# Patient Record
Sex: Female | Born: 1960
Health system: Southern US, Community
[De-identification: ages and names within clinical notes are randomized; demographics above are authoritative.]

## PROBLEM LIST (undated history)

## (undated) DIAGNOSIS — Z8711 Personal history of peptic ulcer disease: Secondary | ICD-10-CM

## (undated) DIAGNOSIS — F419 Anxiety disorder, unspecified: Secondary | ICD-10-CM

## (undated) DIAGNOSIS — K219 Gastro-esophageal reflux disease without esophagitis: Secondary | ICD-10-CM

## (undated) DIAGNOSIS — H8110 Benign paroxysmal vertigo, unspecified ear: Secondary | ICD-10-CM

## (undated) DIAGNOSIS — E7800 Pure hypercholesterolemia, unspecified: Secondary | ICD-10-CM

## (undated) DIAGNOSIS — Z8719 Personal history of other diseases of the digestive system: Secondary | ICD-10-CM

## (undated) DIAGNOSIS — R718 Other abnormality of red blood cells: Secondary | ICD-10-CM

## (undated) DIAGNOSIS — T7840XA Allergy, unspecified, initial encounter: Secondary | ICD-10-CM

## (undated) DIAGNOSIS — Z9889 Other specified postprocedural states: Secondary | ICD-10-CM

## (undated) DIAGNOSIS — M1990 Unspecified osteoarthritis, unspecified site: Secondary | ICD-10-CM

## (undated) DIAGNOSIS — R112 Nausea with vomiting, unspecified: Secondary | ICD-10-CM

## (undated) DIAGNOSIS — I10 Essential (primary) hypertension: Secondary | ICD-10-CM

## (undated) DIAGNOSIS — G43909 Migraine, unspecified, not intractable, without status migrainosus: Secondary | ICD-10-CM

## (undated) DIAGNOSIS — D649 Anemia, unspecified: Secondary | ICD-10-CM

## (undated) HISTORY — DX: Nausea with vomiting, unspecified: R11.2

## (undated) HISTORY — DX: Other specified postprocedural states: Z98.890

## (undated) HISTORY — DX: Personal history of other diseases of the digestive system: Z87.19

## (undated) HISTORY — DX: Migraine, unspecified, not intractable, without status migrainosus: G43.909

## (undated) HISTORY — DX: Benign paroxysmal vertigo, unspecified ear: H81.10

## (undated) HISTORY — DX: Gastro-esophageal reflux disease without esophagitis: K21.9

## (undated) HISTORY — DX: Other abnormality of red blood cells: R71.8

## (undated) HISTORY — DX: Anxiety disorder, unspecified: F41.9

## (undated) HISTORY — DX: Anemia, unspecified: D64.9

## (undated) HISTORY — DX: Allergy, unspecified, initial encounter: T78.40XA

## (undated) HISTORY — DX: Personal history of peptic ulcer disease: Z87.11

## (undated) HISTORY — DX: Unspecified osteoarthritis, unspecified site: M19.90

---

## 1984-03-07 HISTORY — PX: TUBAL LIGATION: SHX77

## 1997-06-05 ENCOUNTER — Encounter: Admission: RE | Admit: 1997-06-05 | Discharge: 1997-06-05 | Payer: Self-pay | Admitting: Family Medicine

## 1997-06-09 ENCOUNTER — Encounter: Admission: RE | Admit: 1997-06-09 | Discharge: 1997-06-09 | Payer: Self-pay | Admitting: Family Medicine

## 1997-06-24 ENCOUNTER — Encounter: Admission: RE | Admit: 1997-06-24 | Discharge: 1997-06-24 | Payer: Self-pay | Admitting: Family Medicine

## 1998-01-20 ENCOUNTER — Emergency Department (HOSPITAL_COMMUNITY): Admission: EM | Admit: 1998-01-20 | Discharge: 1998-01-20 | Payer: Self-pay | Admitting: Emergency Medicine

## 1998-02-13 ENCOUNTER — Encounter: Admission: RE | Admit: 1998-02-13 | Discharge: 1998-02-13 | Payer: Self-pay | Admitting: Family Medicine

## 1999-08-22 ENCOUNTER — Emergency Department (HOSPITAL_COMMUNITY): Admission: EM | Admit: 1999-08-22 | Discharge: 1999-08-22 | Payer: Self-pay | Admitting: *Deleted

## 2000-03-07 HISTORY — PX: ABDOMINAL HYSTERECTOMY: SHX81

## 2000-05-10 ENCOUNTER — Encounter: Admission: RE | Admit: 2000-05-10 | Discharge: 2000-05-10 | Payer: Self-pay | Admitting: *Deleted

## 2000-05-10 ENCOUNTER — Encounter: Payer: Self-pay | Admitting: *Deleted

## 2000-06-15 ENCOUNTER — Encounter (INDEPENDENT_AMBULATORY_CARE_PROVIDER_SITE_OTHER): Payer: Self-pay

## 2000-06-15 ENCOUNTER — Inpatient Hospital Stay (HOSPITAL_COMMUNITY): Admission: RE | Admit: 2000-06-15 | Discharge: 2000-06-17 | Payer: Self-pay | Admitting: *Deleted

## 2001-04-26 ENCOUNTER — Encounter: Payer: Self-pay | Admitting: Emergency Medicine

## 2001-04-26 ENCOUNTER — Emergency Department (HOSPITAL_COMMUNITY): Admission: EM | Admit: 2001-04-26 | Discharge: 2001-04-26 | Payer: Self-pay | Admitting: Emergency Medicine

## 2001-05-08 ENCOUNTER — Encounter: Payer: Self-pay | Admitting: *Deleted

## 2001-05-08 ENCOUNTER — Ambulatory Visit (HOSPITAL_COMMUNITY): Admission: RE | Admit: 2001-05-08 | Discharge: 2001-05-08 | Payer: Self-pay | Admitting: *Deleted

## 2001-06-06 ENCOUNTER — Encounter: Payer: Self-pay | Admitting: *Deleted

## 2001-06-06 ENCOUNTER — Ambulatory Visit (HOSPITAL_COMMUNITY): Admission: RE | Admit: 2001-06-06 | Discharge: 2001-06-06 | Payer: Self-pay | Admitting: *Deleted

## 2002-03-10 ENCOUNTER — Encounter: Payer: Self-pay | Admitting: Emergency Medicine

## 2002-03-10 ENCOUNTER — Emergency Department (HOSPITAL_COMMUNITY): Admission: EM | Admit: 2002-03-10 | Discharge: 2002-03-10 | Payer: Self-pay | Admitting: Emergency Medicine

## 2002-04-23 ENCOUNTER — Emergency Department (HOSPITAL_COMMUNITY): Admission: EM | Admit: 2002-04-23 | Discharge: 2002-04-23 | Payer: Self-pay | Admitting: Emergency Medicine

## 2002-06-21 ENCOUNTER — Ambulatory Visit (HOSPITAL_COMMUNITY): Admission: RE | Admit: 2002-06-21 | Discharge: 2002-06-21 | Payer: Self-pay | Admitting: *Deleted

## 2002-06-21 ENCOUNTER — Encounter: Payer: Self-pay | Admitting: *Deleted

## 2003-05-27 ENCOUNTER — Emergency Department (HOSPITAL_COMMUNITY): Admission: EM | Admit: 2003-05-27 | Discharge: 2003-05-28 | Payer: Self-pay | Admitting: Emergency Medicine

## 2003-07-24 ENCOUNTER — Ambulatory Visit (HOSPITAL_COMMUNITY): Admission: RE | Admit: 2003-07-24 | Discharge: 2003-07-24 | Payer: Self-pay | Admitting: *Deleted

## 2004-07-28 ENCOUNTER — Emergency Department (HOSPITAL_COMMUNITY): Admission: EM | Admit: 2004-07-28 | Discharge: 2004-07-28 | Payer: Self-pay | Admitting: *Deleted

## 2004-10-13 ENCOUNTER — Ambulatory Visit (HOSPITAL_COMMUNITY): Admission: RE | Admit: 2004-10-13 | Discharge: 2004-10-13 | Payer: Self-pay | Admitting: *Deleted

## 2005-01-02 ENCOUNTER — Emergency Department (HOSPITAL_COMMUNITY): Admission: EM | Admit: 2005-01-02 | Discharge: 2005-01-02 | Payer: Self-pay | Admitting: Emergency Medicine

## 2006-05-24 ENCOUNTER — Ambulatory Visit (HOSPITAL_COMMUNITY): Admission: RE | Admit: 2006-05-24 | Discharge: 2006-05-24 | Payer: Self-pay | Admitting: *Deleted

## 2007-03-08 HISTORY — PX: OTHER SURGICAL HISTORY: SHX169

## 2007-06-05 ENCOUNTER — Ambulatory Visit (HOSPITAL_COMMUNITY): Admission: RE | Admit: 2007-06-05 | Discharge: 2007-06-05 | Payer: Self-pay | Admitting: *Deleted

## 2008-07-25 ENCOUNTER — Ambulatory Visit (HOSPITAL_COMMUNITY): Admission: RE | Admit: 2008-07-25 | Discharge: 2008-07-25 | Payer: Self-pay | Admitting: Obstetrics and Gynecology

## 2009-07-12 ENCOUNTER — Emergency Department (HOSPITAL_COMMUNITY): Admission: EM | Admit: 2009-07-12 | Discharge: 2009-07-13 | Payer: Self-pay | Admitting: Emergency Medicine

## 2009-07-28 ENCOUNTER — Ambulatory Visit (HOSPITAL_COMMUNITY): Admission: RE | Admit: 2009-07-28 | Discharge: 2009-07-28 | Payer: Self-pay | Admitting: Obstetrics and Gynecology

## 2010-03-28 ENCOUNTER — Encounter: Payer: Self-pay | Admitting: *Deleted

## 2010-05-25 LAB — BASIC METABOLIC PANEL
BUN: 10 mg/dL (ref 6–23)
Calcium: 10 mg/dL (ref 8.4–10.5)
Glucose, Bld: 105 mg/dL — ABNORMAL HIGH (ref 70–99)
Sodium: 136 mEq/L (ref 135–145)

## 2010-05-25 LAB — DIFFERENTIAL
Basophils Absolute: 0.2 10*3/uL — ABNORMAL HIGH (ref 0.0–0.1)
Basophils Relative: 3 % — ABNORMAL HIGH (ref 0–1)
Eosinophils Relative: 1 % (ref 0–5)
Lymphocytes Relative: 43 % (ref 12–46)
Monocytes Absolute: 0.5 10*3/uL (ref 0.1–1.0)
Neutro Abs: 3.9 10*3/uL (ref 1.7–7.7)
Neutrophils Relative %: 47 % (ref 43–77)

## 2010-05-25 LAB — CBC
MCV: 82 fL (ref 78.0–100.0)
Platelets: 259 10*3/uL (ref 150–400)
RDW: 14.3 % (ref 11.5–15.5)

## 2010-07-23 NOTE — H&P (Signed)
[facility]  Patient:    Connie Arroyo, Connie Arroyo                       MRN: [id number] Attending:  Marina Gravel, M.D. CC:         Heather Roberts, M.D.   History and Physical  DATE OF SURGERY:              June 15, 2000 at [facility].  CHIEF COMPLAINT:              Left lower quadrant pain and uterine fibroids.  HISTORY OF PRESENT ILLNESS:   A 50 year old African-American female, gravida 2, para 2, referred from Dr. Heather Roberts for history of dysmenorrhea. The patient has been treated with birth control pills, naproxen, and Lodine without relief of symptoms. Her pain is worse with standing, and it hurts primarily on the left side. Ultrasound performed May 10, 2000 showed uterus was marginal for fibroids, largest was 3 cm in diameter. The endometrial stripe was normal and the ovaries appeared normal. Overall the patient has had a problem with pain for about one year and has also had associated menorrhagia.  PAST MEDICAL HISTORY:         None.  PAST SURGICAL HISTORY:        Tubal ligation which was laparoscopic in 1984 and there was no report of any abnormalities.  MEDICATIONS:                  Naproxen and Estrostep Fe.  ALLERGIES:                    Codeine.  SOCIAL HISTORY:               The patient does not smoke, occasional alcohol use. No other drugs. No history of domestic violence.  FAMILY HISTORY:               Family history of diabetes and hypertension, and a grandmother with breast cancer.  REVIEW OF SYSTEMS:            Constitutional: No unexplained weight change, fever, dizzy spells or fainting. Eyes: No problems with vision. ENT: No problems with ears, hearing, nosebleeds. She does have occasional sinus problems. Cardiovascular: No chest pain or irregular heart beat. Respiratory: Does have occasional cough. Gastrointestinal: Intermittent constipation. No nausea, vomiting, blood in stools, heartburn, indigestion, or  diarrhea. Genitourinary: Per history of present illness. Musculoskeletal: No joint or muscle pain. Skin/breast: No lesions. Neurological: No headaches, dizzy spells, or trouble with bowel. Psychiatric: No work or family problems, history of domestic violence or sexual assault. Endocrine: History of unspecified thyroid disorder. No current symptoms. Hematologic: No unexplained bruising or easy bleeding.  PHYSICAL EXAMINATION:  VITAL SIGNS:                  Blood pressure 110/82. Weight 157. Height 5 feet 3 inches.  GENERAL:                      The patient is alert and oriented. No acute distress. Normal body habitus.  NECK:                         Supple, no adenopathy, no thyromegaly.  LUNGS:                        Clear to auscultation.  HEART:                        Regular rate and rhythm.  ABDOMEN:                      Normal. Liver and spleen normal. No hernia.  LYMPH NODE SURVEY:            Negative in neck, axilla, and groin.  SKIN:                         No lesions.  BREASTS:                      No masses, nipple discharge, or adenopathy. It has been recommended that the patient obtain screening mammogram.  PELVIC:                       Normal external female genitalia. Vagina and cervix normal. Uterus enlarged, 12-14 week size consistent with fibroids. It was painful to palpation at the left cornua. Adnexa: no masses palpable. Urethra, bladder base, anus, perineum normal.  ASSESSMENT/PLAN:              Dysmenorrhea, menorrhagia, and fibroids not responding to oral contraceptive pills and nonsteroidals. The patient continues to have complaints of severe pain and requests definitive therapy. We discussed options of abdominal hysterectomy versus LAVH. I have recommended abdominal hysterectomy given the enlarged uterus and symptoms of pain. I have also discussed pros and cons of bilateral salpingo-oophorectomy as the patient has pain and the exact etiology is  unclear. I have recommended she at least consider BSO to reduce the potential need for repeat operation. I have discussed the need for hormone replacement therapy. The patient agrees. Operative risks discussed including infection, bleeding, damage to bowel, bladder, surrounding organs. All questions answered. Will proceed with total abdominal hysterectomy possible bilateral salpingo-oophorectomy June 15, 2000 at [facility]. DD:  06/05/00 TD:  06/05/00 Job: [id number]

## 2010-07-23 NOTE — Op Note (Signed)
[facility]  Patient:    Connie Arroyo, Connie Arroyo                     MRN: [id number] Proc. Date: 06/15/00 Adm. Date:  [id number] Attending:  Marina Gravel B                           Operative Report  PREOPERATIVE DIAGNOSIS:       Uterine fibroids and pelvic pain.  POSTOPERATIVE DIAGNOSIS:      Uterine fibroids and pelvic pain.  PROCEDURE:                    Total abdominal hysterectomy and bilateral salpingo-oophorectomy.  SURGEON:                      Marina Gravel, M.D.  ASSISTANT:                    Sung Amabile. Roslyn Smiling, M.D.  ANESTHESIA:                   General.  FINDINGS:                     A 12-week sized fibroid uterus.  Normal tubes and ovaries.  Normal appearing appendix.  SPECIMENS REMOVED:            Uterus and cervix, bilateral tubes and ovaries.  ESTIMATED BLOOD LOSS:         100 cc.  FLUIDS:                       1700 cc.  URINE OUTPUT:                 150 cc.  COMPLICATIONS:                None.  DESCRIPTION OF PROCEDURE:     The patient was taken to the operating room and general anesthesia obtained.  She was examined under anesthesia and a 12-week sized uterus was noted.  No adnexal masses were palpable.  Rectovaginal exam confirmed.  She was prepped in the standard fashion.  A Foley catheter was inserted and she was draped for a transverse incision.  A Pfannenstiel incision was made with a knife and carried sharply to the underlying fascia.  The fascia was divided in the midline.  The incision was extended laterally with Mayo scissors.  Kocher clamps were used to elevate the superior aspect of the incision.  The underlying rectus muscles were dissected off sharply.  This was repeated inferiorly in a similar fashion.  The midline of the rectus muscles was identified.  The underlying peritoneum was elevated and entered sharply with Metzenbaum scissors.  This was extended superiorly and inferiorly sharply with good visualization  of surrounding organs.  The pelvis was explored with the above findings noted.  The patient was placed in Trendelenburg position and a self retaining retractor was placed in the abdomen.  Care was taken to avoid pressure on the psoas muscles.  The bowel was packed superiorly with moist laps and the uterus grasped at each cornu with a long Kelly clamp.  The left round ligament was identified, suture ligated and divided.  The posterior leaf of the broad ligament was incised with a Bovie.  The retroperitoneal space was developed bluntly.  The ureter was identified.  The IP  ligament was isolated, doubly clamped, divided and suture ligated x 2 with 0 Vicryl. Hemostasis was obtained.  A bladder flap was created with sharp technique.  This was repeated on the right side in a similar fashion.  A bladder flap was developed with sharp technique and advanced with an open Raytec.  Care was taken to keep the bladder mobilized 2 cm inferior to the area of dissection.  The uterine arteries were skeletonized and clamped on each side with curved Heaney clamps, divided and suture ligated with 0 Vicryl.  Hemostasis was obtained.  The cardinal ligaments were then serially clamped with straight Heaney clamps, divided and suture ligated with 0 Vicryl with hemostasis obtained.  The uterosacral ligament were each clamped with a curved Heaney clamp after reidentification of the ureter.  Each uterosacral ligament was then divided and suture ligated with 0 Vicryl.  The vaginal angle was clamped on each side with curved Heaney clamp, divided and suture ligated with 0 Vicryl.  The remainder of the vaginal cuff was circumscribed with curved scissors.  The remainder of the cuff was closed with figure-of-eight sutures of 0 Vicryl.  The pelvis was irrigated and the cuff was hemostatic, as was each IP ligament.  The packs were removed and the upper abdomen explored.  No abnormalities were noted.  The appendix was  normal.  The fascia was closed with a running stitch of 0 Vicryl.  The subcutaneous tissue was irrigated and made hemostatic with the Bovie.  The skin was closed with staples.  All counts were correct per the operating room staff.  The patient tolerated the procedure well without complications.  She was taken to the recovery room awake, alert and in stable condition. DD:  06/15/00 TD:  06/15/00 Job: [id number]

## 2010-07-23 NOTE — Discharge Summary (Signed)
[facility]  Patient:    Connie Arroyo, Connie Arroyo                     MRN: [id number] Adm. Date:  [id number] Disc. Date: [id number] Attending:  Marina Gravel B                           Discharge Summary  ADMISSION DIAGNOSES:          Left lower quadrant pain and uterine fibroids.  DISCHARGE DIAGNOSES:          Uterine fibroids.  PROCEDURE:                    Total abdominal hysterectomy, bilateral salpingo-oophorectomy.  FINDINGS:                     A 12 week sized fibroid uterus, normal appearing ovaries and tubes.  SPECIMENS:                    Uterus, cervix, tubes, ovaries.  HISTORY OF PRESENT ILLNESS:   For complete details please see the H&P in the chart.  Briefly, patient was referred from Dr. Orson Aloe with a history of dysmenorrhea.  She had been managed medically and was having continued problems with pain.  She requested definitive surgical treatment.  HOSPITAL COURSE:              On the day of admission patient underwent TAH, BSO.  The above findings were noted.  There were no complications.  Postoperatively patient rapidly regained ability to ambulate, void, and tolerate a regular diet.  She was discharged home on the second postoperative day in satisfactory condition.  Postoperative hemoglobin was 9.8.  DISCHARGE INSTRUCTIONS:       No heavy lifting for six weeks.  No driving for two weeks.  Report fever, nausea, vomiting, redness at the incision site, problems with pain medications, or questions to me through the office.  FOLLOW-UP:                    Four weeks [facility].  One to two days for staple removal.  DISCHARGE MEDICATIONS:        1. Darvocet one to two tablets q.4-6h. p.r.n.                                  pain.                               2. Vivelle 0.075 mg q.d. change twice weekly.  DISPOSITION:                  Satisfactory. DD:  07/03/00 TD:  07/03/00 Job: [id number]

## 2010-08-10 ENCOUNTER — Other Ambulatory Visit (HOSPITAL_COMMUNITY): Payer: Self-pay | Admitting: Internal Medicine

## 2010-08-10 DIAGNOSIS — Z1231 Encounter for screening mammogram for malignant neoplasm of breast: Secondary | ICD-10-CM

## 2010-08-19 ENCOUNTER — Ambulatory Visit (HOSPITAL_COMMUNITY): Payer: Self-pay

## 2010-08-27 ENCOUNTER — Ambulatory Visit (HOSPITAL_COMMUNITY)
Admission: RE | Admit: 2010-08-27 | Discharge: 2010-08-27 | Disposition: A | Discharge status: Home / Self Care | Payer: Self-pay | Source: Ambulatory Visit | Attending: Internal Medicine | Admitting: Internal Medicine

## 2010-08-27 DIAGNOSIS — Z1231 Encounter for screening mammogram for malignant neoplasm of breast: Secondary | ICD-10-CM

## 2011-07-12 ENCOUNTER — Other Ambulatory Visit (HOSPITAL_COMMUNITY): Payer: Self-pay | Admitting: Internal Medicine

## 2011-07-12 DIAGNOSIS — Z1231 Encounter for screening mammogram for malignant neoplasm of breast: Secondary | ICD-10-CM

## 2011-08-29 ENCOUNTER — Ambulatory Visit (HOSPITAL_COMMUNITY)
Admission: RE | Admit: 2011-08-29 | Discharge: 2011-08-29 | Disposition: A | Discharge status: Home / Self Care | Payer: Self-pay | Source: Ambulatory Visit | Attending: Internal Medicine | Admitting: Internal Medicine

## 2011-08-29 DIAGNOSIS — Z1231 Encounter for screening mammogram for malignant neoplasm of breast: Secondary | ICD-10-CM

## 2012-12-03 ENCOUNTER — Other Ambulatory Visit: Payer: Self-pay | Admitting: Obstetrics and Gynecology

## 2012-12-03 DIAGNOSIS — Z1231 Encounter for screening mammogram for malignant neoplasm of breast: Secondary | ICD-10-CM

## 2012-12-04 ENCOUNTER — Ambulatory Visit (HOSPITAL_COMMUNITY): Payer: Self-pay

## 2012-12-25 ENCOUNTER — Inpatient Hospital Stay (HOSPITAL_COMMUNITY): Admission: RE | Admit: 2012-12-25 | Payer: Self-pay | Source: Ambulatory Visit

## 2012-12-25 ENCOUNTER — Ambulatory Visit (HOSPITAL_COMMUNITY): Payer: Self-pay | Attending: Obstetrics and Gynecology

## 2013-02-12 ENCOUNTER — Ambulatory Visit (HOSPITAL_COMMUNITY)
Admission: RE | Admit: 2013-02-12 | Discharge: 2013-02-12 | Disposition: A | Discharge status: Home / Self Care | Payer: Self-pay | Source: Ambulatory Visit | Attending: Obstetrics and Gynecology | Admitting: Obstetrics and Gynecology

## 2013-02-12 ENCOUNTER — Encounter (HOSPITAL_COMMUNITY): Payer: Self-pay

## 2013-02-12 VITALS — BP 116/78 | Temp 98.2°F | Ht 63.0 in | Wt 142.4 lb

## 2013-02-12 DIAGNOSIS — Z1231 Encounter for screening mammogram for malignant neoplasm of breast: Secondary | ICD-10-CM

## 2013-02-12 DIAGNOSIS — Z1239 Encounter for other screening for malignant neoplasm of breast: Secondary | ICD-10-CM

## 2013-02-12 NOTE — Progress Notes (Signed)
No complaints today.  Pap Smear:    Pap smear not completed today. Last Pap smear was in 2002 and normal per patient. Per patient has no history of an abnormal Pap smear. Patient has a history of a hysterectomy for fibroids. Patient no longer needs Pap smears due to her history of a hysterectomy for benign reasons.  No Pap smear results in EPIC.  Physical exam: Breasts Breasts symmetrical. No skin abnormalities bilateral breasts. On exam observed a lump below the center of the breasts.Per patient the area has been there since before previous mammogram and hasn't changed in size or shape. Encouraged patient to follow up with a physician and if doesn't have a PCP to schedule an appointment for the [facility] program. No nipple retraction bilateral breasts. No nipple discharge bilateral breasts. No lymphadenopathy. No lumps palpated bilateral breasts. No complaints of pain or tenderness on exam. Patient escorted to mammography for a screening mammogram.        Pelvic/Bimanual No Pap smear completed today since has a history of a hysterectomy for benign reasons. Pap smear not indicated per BCCCP guidelines.

## 2013-02-12 NOTE — Patient Instructions (Signed)
Taught Connie Arroyo how to perform BSE and gave educational materials to take home. Patient did not need a Pap smear today due to a history of a hysterectomy for benign reasons. Let patient know that she no longer needs Pap smears due to her history of a hysterectomy for benign reasons. Recommended to patient that she schedule an appointment for the [facility] program. Patient to contact Sabrina to schedule appointment.  Let patient know will follow up with her within the next couple weeks with results by letter or phone. Connie Arroyo verbalized understanding.  Jeniya Flannigan, Kathaleen Maser, RN 1:42 PM

## 2013-02-19 ENCOUNTER — Other Ambulatory Visit: Payer: Self-pay | Admitting: Obstetrics and Gynecology

## 2013-02-19 DIAGNOSIS — R928 Other abnormal and inconclusive findings on diagnostic imaging of breast: Secondary | ICD-10-CM

## 2013-02-27 ENCOUNTER — Ambulatory Visit
Admission: RE | Admit: 2013-02-27 | Discharge: 2013-02-27 | Disposition: A | Discharge status: Home / Self Care | Payer: No Typology Code available for payment source | Source: Ambulatory Visit | Attending: Obstetrics and Gynecology | Admitting: Obstetrics and Gynecology

## 2013-02-27 DIAGNOSIS — R928 Other abnormal and inconclusive findings on diagnostic imaging of breast: Secondary | ICD-10-CM

## 2013-05-06 HISTORY — PX: COLONOSCOPY: SHX174

## 2013-12-21 ENCOUNTER — Ambulatory Visit (INDEPENDENT_AMBULATORY_CARE_PROVIDER_SITE_OTHER): Payer: No Typology Code available for payment source | Admitting: Family Medicine

## 2013-12-21 VITALS — BP 127/86 | HR 81 | Temp 98.1°F | Ht 63.25 in | Wt 149.2 lb

## 2013-12-21 DIAGNOSIS — K029 Dental caries, unspecified: Secondary | ICD-10-CM

## 2013-12-21 DIAGNOSIS — K047 Periapical abscess without sinus: Secondary | ICD-10-CM

## 2013-12-21 MED ORDER — HYDROCODONE-ACETAMINOPHEN 5-325 MG PO TABS: 1.0000 | ORAL_TABLET | Freq: Three times a day (TID) | ORAL | Status: DC | PRN

## 2013-12-21 MED ORDER — PENICILLIN V POTASSIUM 500 MG PO TABS: 500.0000 mg | ORAL_TABLET | Freq: Three times a day (TID) | ORAL | Status: DC

## 2013-12-21 NOTE — Progress Notes (Signed)
[facility] [address], [place] [address] [phone]  Date:  12/21/2013   Name:  Connie Arroyo   DOB:  1960-12-22   MRN:  [id number]  PCP:  No primary provider on file.    Chief Complaint: Dental Pain   History of Present Illness:  Connie Arroyo is a 53 y.o. very pleasant female patient who presents with the following:  She is here today with a tooth problem for a couple of days- left bottom jaw. The tooth has been broken off, and it is "rotten" and has become painful.  She is otherwise generally healthy She does have a dentist who is going to see her next week.   She has not noted a fever.    There are no active problems to display for this patient.   Past Medical History  Diagnosis Date  . Allergy   . Anxiety   . History of stomach ulcers     Past Surgical History  Procedure Laterality Date  . Abdominal hysterectomy  2002  . Tubal ligation  1986  . Left shoulder surgery  2009    History  Substance Use Topics  . Smoking status: Never Smoker   . Smokeless tobacco: Never Used  . Alcohol Use: No    Family History  Problem Relation Age of Onset  . Hypertension Mother   . Diabetes Father   . Hypertension Father   . Stroke Father   . Anuerysm Sister   . Stroke Sister   . Breast cancer Paternal Grandmother     Allergies  Allergen Reactions  . Codeine     Medication list has been reviewed and updated.  No current outpatient prescriptions on file prior to visit.   No current facility-administered medications on file prior to visit.    Review of Systems:  As per HPI- otherwise negative.   Physical Examination: Filed Vitals:   12/21/13 1348  BP: 127/86  Pulse: 81  Temp: 98.1 F (36.7 C)   Filed Vitals:   12/21/13 1348  Height: 5' 3.25" (1.607 m)  Weight: 149 lb 4 oz (67.699 kg)   Body mass index is 26.22 kg/(m^2). Ideal Body Weight: Weight in (lb) to have BMI = 25: 142  GEN: WDWN, NAD, Non-toxic, A & O  x 3, looks well except left cheek is slightly swollen HEENT: Atraumatic, Normocephalic. Neck supple. No masses, No LAD.  Bilateral TM wnl, oropharynx normal.  PEERL,EOMI.   Numbers 18 and 19 are broken, and number 19 is tender, redness and swelling of surrounding gum.   Ears and Nose: No external deformity. CV: RRR, No M/G/R. No JVD. No thrill. No extra heart sounds. PULM: CTA B, no wheezes, crackles, rhonchi. No retractions. No resp. distress. No accessory muscle use. EXTR: No c/c/e NEURO Normal gait.  PSYCH: Normally interactive. Conversant. Not depressed or anxious appearing.  Calm demeanor.    Assessment and Plan: Dental infection - Plan: penicillin v potassium (VEETID) 500 MG tablet  Pain due to dental caries - Plan: HYDROcodone-acetaminophen (NORCO/VICODIN) 5-325 MG per tablet  Treat for dental infection and pain with penicillin.  She may use vicodin as needed- she has a history of NV with codeine but no other allergic reaction to pain medications.   She has an appt to see DDS next week; urged her to keep this appt. Gave her a note for her job as well  Signed Abbe AmsterdamJessica Copland, MD

## 2013-12-21 NOTE — Patient Instructions (Signed)
Use the penicillin three times a day until you are seen by your dentist.  You can also use the pain medication as needed- remember this can make you feel sleepy.   Try to keep the area clean by swishing with warm water after eating.  Let us know if you need anything else in the meantime!

## 2014-01-14 ENCOUNTER — Other Ambulatory Visit (HOSPITAL_COMMUNITY): Payer: Self-pay | Admitting: Nurse Practitioner

## 2014-01-14 DIAGNOSIS — Z1231 Encounter for screening mammogram for malignant neoplasm of breast: Secondary | ICD-10-CM

## 2014-01-19 ENCOUNTER — Other Ambulatory Visit: Payer: Self-pay | Admitting: Family Medicine

## 2014-01-19 DIAGNOSIS — K047 Periapical abscess without sinus: Secondary | ICD-10-CM

## 2014-01-20 NOTE — Telephone Encounter (Signed)
Called and let her know I did her refill.  She does not have a fever and is not ill, but she has started to notice some swelling of her cheek over the tooth again and wanted to head it off.  She will let me know if any other problems

## 2014-03-04 ENCOUNTER — Ambulatory Visit (HOSPITAL_COMMUNITY): Payer: Self-pay

## 2014-03-18 ENCOUNTER — Ambulatory Visit (HOSPITAL_COMMUNITY)
Admission: RE | Admit: 2014-03-18 | Discharge: 2014-03-18 | Disposition: A | Discharge status: Home / Self Care | Payer: Self-pay | Source: Ambulatory Visit | Attending: Nurse Practitioner | Admitting: Nurse Practitioner

## 2014-03-18 DIAGNOSIS — Z1231 Encounter for screening mammogram for malignant neoplasm of breast: Secondary | ICD-10-CM

## 2014-03-24 ENCOUNTER — Other Ambulatory Visit: Payer: Self-pay | Admitting: Obstetrics and Gynecology

## 2014-03-24 ENCOUNTER — Telehealth (HOSPITAL_COMMUNITY): Payer: Self-pay | Admitting: *Deleted

## 2014-03-24 DIAGNOSIS — R928 Other abnormal and inconclusive findings on diagnostic imaging of breast: Secondary | ICD-10-CM

## 2014-03-24 NOTE — Telephone Encounter (Signed)
Telephoned patient at home # and left message to return call to B[facility]CP 

## 2014-04-17 ENCOUNTER — Encounter (HOSPITAL_COMMUNITY): Payer: Self-pay

## 2014-04-17 ENCOUNTER — Ambulatory Visit (HOSPITAL_COMMUNITY)
Admission: RE | Admit: 2014-04-17 | Discharge: 2014-04-17 | Disposition: A | Discharge status: Home / Self Care | Payer: Self-pay | Source: Ambulatory Visit | Attending: Obstetrics and Gynecology | Admitting: Obstetrics and Gynecology

## 2014-04-17 VITALS — BP 110/64 | Temp 97.5°F | Ht 64.0 in | Wt 147.4 lb

## 2014-04-17 DIAGNOSIS — Z1239 Encounter for other screening for malignant neoplasm of breast: Secondary | ICD-10-CM

## 2014-04-17 NOTE — Patient Instructions (Signed)
Education materials on breast awareness given. Explained to Connie Arroyo that she did not need a Pap smear today due to patient has a history of a hysterectomy for benign reasons. Let patient know that she does not need any further Pap smears. [facility] for right breast diagnostic mammogram per recommendation. Appointment scheduled for Monday, April 21, 2014 at 1000. Patient aware of appointment and will be there. Connie MomentSheila A Arroyo verbalized understanding.  Cervando Durnin, Kathaleen Maserhristine Poll, RN 11:07 AM

## 2014-04-17 NOTE — Progress Notes (Signed)
Complaints of right breast pain that comes and goes. Patient rates pain at a 5 out of 10. [facility] due to needing additional imaging. Screening mamm[facility].  Pap Smear:  Pap smear not completed today. Last Pap smear was in 2002 per patient and normal. Per patient has no history of an abnormal Pap smear. Patient has a history of a complete hysterectomy 06/15/2000 for fibroids. Patient does not need any further Pap smears due to her history of a hysterectomy for benign reasons. No Pap smear results are in EPIC.  Physical exam: Breasts Breasts symmetrical. No skin abnormalities bilateral breasts. No nipple retraction bilateral breasts. No nipple discharge bilateral breasts. No lymphadenopathy. No lumps palpated bilateral breasts. No complaints of pain or tenderness on exam. [facility] for right breast diagnostic mammogram per recommendation. Appointment scheduled for Monday, April 21, 2014 at 1000.  Pelvic/Bimanual No Pap smear completed today since patient has a history of a hysterectomy for benign reasons. Pap smear not indicated per BCCCP guidelines.

## 2014-04-21 ENCOUNTER — Inpatient Hospital Stay: Admission: RE | Admit: 2014-04-21 | Payer: Self-pay | Source: Ambulatory Visit

## 2014-04-21 ENCOUNTER — Other Ambulatory Visit: Payer: Self-pay

## 2014-04-24 ENCOUNTER — Other Ambulatory Visit: Payer: Self-pay

## 2014-05-01 ENCOUNTER — Ambulatory Visit
Admission: RE | Admit: 2014-05-01 | Discharge: 2014-05-01 | Disposition: A | Discharge status: Home / Self Care | Payer: No Typology Code available for payment source | Source: Ambulatory Visit | Attending: Obstetrics and Gynecology | Admitting: Obstetrics and Gynecology

## 2014-05-01 DIAGNOSIS — R928 Other abnormal and inconclusive findings on diagnostic imaging of breast: Secondary | ICD-10-CM

## 2014-08-01 ENCOUNTER — Ambulatory Visit: Payer: Self-pay

## 2014-08-01 ENCOUNTER — Other Ambulatory Visit: Payer: Self-pay

## 2014-08-07 ENCOUNTER — Ambulatory Visit: Payer: Self-pay

## 2014-08-21 ENCOUNTER — Ambulatory Visit: Payer: Self-pay

## 2014-08-22 ENCOUNTER — Ambulatory Visit: Payer: Self-pay

## 2014-08-22 NOTE — Progress Notes (Signed)
Patient called to say that she woke up late and is now on way to work. Will have to reschedule.

## 2014-08-29 ENCOUNTER — Other Ambulatory Visit: Payer: Self-pay

## 2014-08-29 ENCOUNTER — Ambulatory Visit (HOSPITAL_BASED_OUTPATIENT_CLINIC_OR_DEPARTMENT_OTHER): Payer: Self-pay

## 2014-08-29 DIAGNOSIS — Z0000 Encounter for general adult medical examination without abnormal findings: Secondary | ICD-10-CM

## 2014-08-29 LAB — HEMOGLOBIN A1C
Hgb A1c MFr Bld: 5.8 % — ABNORMAL HIGH (ref <5.7)
MEAN PLASMA GLUCOSE: 120 mg/dL — AB (ref <117)

## 2014-08-29 LAB — LIPID PANEL
Cholesterol: 297 mg/dL — ABNORMAL HIGH (ref 0–200)
HDL: 91 mg/dL (ref >=46)
LDL CALC: 193 mg/dL — AB (ref 0–99)
TRIGLYCERIDES: 63 mg/dL (ref <150)
Total CHOL/HDL Ratio: 3.3 Ratio
VLDL: 13 mg/dL (ref 0–40)

## 2014-08-29 LAB — GLUCOSE (CC13): Glucose: 104 mg/dl (ref 70–140)

## 2014-08-29 NOTE — Progress Notes (Signed)
Patient is a new patient to the [facility] program and is currently a BCCCP patient effective 04/17/2014 .  Clinical Measurements: Unable to assess on this date.   Medical History: Unable to assess on this date.   Blood Pressure, Self-measurement: Unable to assess on this date.   Nutrition Assessment: Unable to assess on this date.   Physical Activity Assessment: Unable to assess on this date.   Smoking Status:Unable to assess on this date.    Quality of Life AssessmentUnable to assess on this date.   : Plan: Will call patient to reschedule Assessments

## 2014-09-11 ENCOUNTER — Ambulatory Visit: Payer: Self-pay

## 2014-09-11 VITALS — BP 116/74 | HR 75 | Temp 97.8°F | Resp 16 | Ht 63.0 in | Wt 144.5 lb

## 2014-09-11 DIAGNOSIS — Z789 Other specified health status: Secondary | ICD-10-CM

## 2014-09-11 NOTE — Progress Notes (Signed)
[facility] and is currently a [facility] patient effective 04/17/2014.   Clinical Measurements: Patient is 5 ft. 3 inches, weight 144.5 lbs, waist circumference 32 inches, and hip circumference 40.5inches.   Medical History: Patient has history of high cholesterol and is taking pravastatin. Patient states that forgets to take medication on a lot of days. Patient states does not have a history of hypertension or diabetes. Per patient no diagnosed history of coronary heart disease, heart attack, heart failure, stroke/TIA, vascular disease or congenital heart defects.   Blood Pressure, Self-measurement: Patient states has no reason to check Blood pressure.  Nutrition Assessment: Patient stated that eats 1 fruit every day. Patient states she eats one to 0 servings of vegetables a day. Per patient does not eat 3 or more ounces of whole grains daily. Patient stated that doesn't eat two or more servings of fish weekly. Patient states does not like fish.. Patient states she does drink more than 36 ounces or 450 calories of beverages with added sugars weekly. Patient states she drinks a lot of water. Patient stated she does watch her salt intake.  Physical Activity Assessment: Patient stated she does power very little moderate exercise. Per patient around 85 minutes and no vigorous exercise.   Smoking Status: Per patient has never smoked. Patient states is not exposed to smoke.  Quality of Life Assessment: In assessing patient's quality of life she stated that out of the past 30 days that she has felt her health was good for all but 3 days. Patient also stated that in the past 30 days that her mental health was not good including stress, depression and problems with emotions for 5 days. Patient did state that out of the past 30 days she felt her physical or mental health had not kept her from doing her usual activities including self-care, work or recreation.   Plan: Lab  work has been done including a lipid panel, blood glucose, and Hgb A1C. Will do Health Coaching today with [facility].   Patient is present for follow up in [facility] for Health Coaching regarding nutrition and activity,  ASSESSMENT: Patients weight is 144.5, A1C - 5.8, total cholesterol 297 on medication and LDL's 193. Patient atates has not been eating correct foods and taking medication every night. Patient states does not want to become a diabetic.  OUTCOME: Loose 10 pounds and start walking.  PLAN: Patient stated did not want to stop eating everything. [facility] was offered the [facility] and reviewed it. Patient would like to use the [facility] material. Patient will review materials. Given a pedometer because plans on walking more.   IMPLEMENTATION: Will decrease amount of carbohydrates per day. Use better oil when cooking. Patient will not eat many fried foods and process foods. Will measure food and use portion control.   EVALUATION:  Call in a month and see how patient is progressing torward goal..

## 2014-09-11 NOTE — Patient Instructions (Signed)
Patient will follow 1400 calorie meal plan. Will review all handouts and exercise/activity book. Will increase fiber in diet. Will decrease Carbohydrates to less than 200 per day. Will increase exercise and use pedometer. Will measure portion sizes. Will call if has any questions. Will call patient in three weeks. Patient verbalized understanding.

## 2014-11-05 ENCOUNTER — Telehealth: Payer: Self-pay

## 2014-11-05 NOTE — Telephone Encounter (Signed)
Patient called to follow up on Health Coaching and when would get cholesterol checked again. I informed her that it would be in 12 to 18 months. Discussed how was doing with omega 3's, carbohydrates, sugars and exercise. Will call in one month.(15 mins - 2nd HC)

## 2014-12-15 ENCOUNTER — Telehealth: Payer: Self-pay

## 2014-12-15 NOTE — Telephone Encounter (Signed)
Patient stated that had received information that I had mailed her. Stated that had made some changes but not everything. Explained that would re screen next year in the summer if still qualifies and is in BCCCP.

## 2014-12-29 ENCOUNTER — Other Ambulatory Visit: Payer: Self-pay

## 2014-12-29 DIAGNOSIS — Z1231 Encounter for screening mammogram for malignant neoplasm of breast: Secondary | ICD-10-CM

## 2015-03-23 ENCOUNTER — Ambulatory Visit: Payer: Self-pay

## 2015-03-31 ENCOUNTER — Ambulatory Visit
Admission: RE | Admit: 2015-03-31 | Discharge: 2015-03-31 | Disposition: A | Discharge status: Home / Self Care | Payer: BLUE CROSS/BLUE SHIELD | Source: Ambulatory Visit

## 2015-03-31 DIAGNOSIS — Z1231 Encounter for screening mammogram for malignant neoplasm of breast: Secondary | ICD-10-CM

## 2015-05-14 DIAGNOSIS — G8929 Other chronic pain: Secondary | ICD-10-CM | POA: Insufficient documentation

## 2015-05-14 DIAGNOSIS — M25512 Pain in left shoulder: Secondary | ICD-10-CM | POA: Insufficient documentation

## 2015-05-14 DIAGNOSIS — G479 Sleep disorder, unspecified: Secondary | ICD-10-CM | POA: Insufficient documentation

## 2015-10-07 NOTE — Progress Notes (Signed)
Documentation of [facility] Follow up [facility] from 04/29/15 on telephone.   Patient called for final assessment.  ASSESSMENT :This is a follow up Assessment following [facility] . Marland Kitchen  Medication Status : Patient states is not taking medication for high cholesterol, hypertension, or diabetes. Does take fish oil over the counter.  Blood Pressure, Self-measurement: Patient states has no reason to check Blood pressure.  Nutrition Assessment: Patient stated that she does not eat fruit every day. Patient states she eats 1 serving of vegetables a day. Per Patient does not eat 3 or more ounces of whole grains daily. Patient stated doesn't eat two or more servings of fish weekly.  Patient states she does not drink more than 36 ounces or 450 calories of beverages with added sugars weekly. Patient stated she does watch her salt intake.   Physical Activity Assessment: Patient stated she does around 160 minutes of moderate activity and no vigorous per week.  Smoking Status: Patient stated has never smoked and is not exposed to smoke.  Quality of Life Assessment: In assessing patient's Physical quality of life she stated that out of the past 30 days that she has felt her health was not good for 4 to 5 days. Patient also stated that in the past 30 days that her mental health was not good including stress, depression and problems with emotions for 3 days. Patient did state that out of the past 30 days she felt her physical or mental health had not kept her from doing her usual activities including self-care, work or recreation.   PLAN: Informed patient that could re screen if does not have insurance when and if she returns to [facility] next year.  TIME SPENT with PATIENT: 15 minutes

## 2016-04-11 ENCOUNTER — Emergency Department (HOSPITAL_COMMUNITY)
Admission: EM | Admit: 2016-04-11 | Discharge: 2016-04-12 | Disposition: A | Discharge status: Home / Self Care | Payer: BLUE CROSS/BLUE SHIELD | Attending: Emergency Medicine | Admitting: Emergency Medicine

## 2016-04-11 ENCOUNTER — Emergency Department (HOSPITAL_COMMUNITY): Payer: BLUE CROSS/BLUE SHIELD

## 2016-04-11 DIAGNOSIS — R1011 Right upper quadrant pain: Secondary | ICD-10-CM | POA: Insufficient documentation

## 2016-04-11 DIAGNOSIS — R112 Nausea with vomiting, unspecified: Secondary | ICD-10-CM | POA: Diagnosis not present

## 2016-04-11 DIAGNOSIS — M545 Low back pain, unspecified: Secondary | ICD-10-CM

## 2016-04-11 DIAGNOSIS — R110 Nausea: Secondary | ICD-10-CM

## 2016-04-11 LAB — COMPREHENSIVE METABOLIC PANEL
ALT: 16 U/L (ref 14–54)
AST: 23 U/L (ref 15–41)
Albumin: 4.4 g/dL (ref 3.5–5.0)
Alkaline Phosphatase: 71 U/L (ref 38–126)
Anion gap: 14 (ref 5–15)
BUN: 12 mg/dL (ref 6–20)
CO2: 23 mmol/L (ref 22–32)
Calcium: 10.6 mg/dL — ABNORMAL HIGH (ref 8.9–10.3)
Chloride: 101 mmol/L (ref 101–111)
Creatinine, Ser: 0.91 mg/dL (ref 0.44–1.00)
GFR calc Af Amer: >60 mL/min (ref >60)
GFR calc non Af Amer: >60 mL/min (ref >60)
Glucose, Bld: 96 mg/dL (ref 65–99)
Potassium: 3.5 mmol/L (ref 3.5–5.1)
Sodium: 138 mmol/L (ref 135–145)
Total Bilirubin: 0.6 mg/dL (ref 0.3–1.2)
Total Protein: 8.1 g/dL (ref 6.5–8.1)

## 2016-04-11 LAB — CBC
HCT: 41.3 % (ref 36.0–46.0)
Hemoglobin: 13.8 g/dL (ref 12.0–15.0)
MCH: 26.4 pg (ref 26.0–34.0)
MCHC: 33.4 g/dL (ref 30.0–36.0)
MCV: 79 fL (ref 78.0–100.0)
Platelets: 256 10*3/uL (ref 150–400)
RBC: 5.23 MIL/uL — ABNORMAL HIGH (ref 3.87–5.11)
RDW: 13.4 % (ref 11.5–15.5)
WBC: 9.9 10*3/uL (ref 4.0–10.5)

## 2016-04-11 LAB — URINALYSIS, ROUTINE W REFLEX MICROSCOPIC
Bacteria, UA: NONE SEEN
Bilirubin Urine: NEGATIVE
Glucose, UA: NEGATIVE mg/dL
HGB URINE DIPSTICK: NEGATIVE
Ketones, ur: 80 mg/dL — AB
Leukocytes, UA: NEGATIVE
Nitrite: NEGATIVE
PH: 8 (ref 5.0–8.0)
Protein, ur: 30 mg/dL — AB
SPECIFIC GRAVITY, URINE: 1.026 (ref 1.005–1.030)

## 2016-04-11 LAB — LIPASE, BLOOD: Lipase: 17 U/L (ref 11–51)

## 2016-04-11 MED ORDER — ONDANSETRON HCL 4 MG/2ML IJ SOLN
4.0000 mg | Freq: Once | INTRAMUSCULAR | Status: AC
  Administered 2016-04-11: 4 mg via INTRAVENOUS
  Filled 2016-04-11: qty 2

## 2016-04-11 MED ORDER — ONDANSETRON 4 MG PO TBDP
4.0000 mg | ORAL_TABLET | Freq: Once | ORAL | Status: AC
  Administered 2016-04-11: 4 mg via ORAL

## 2016-04-11 MED ORDER — ONDANSETRON 4 MG PO TBDP
ORAL_TABLET | ORAL | Status: AC
  Filled 2016-04-11: qty 1

## 2016-04-11 MED ORDER — SODIUM CHLORIDE 0.9 % IV BOLUS (SEPSIS)
1000.0000 mL | Freq: Once | INTRAVENOUS | Status: AC
  Administered 2016-04-11: 1000 mL via INTRAVENOUS

## 2016-04-11 MED ORDER — HYDROMORPHONE HCL 2 MG/ML IJ SOLN
0.5000 mg | Freq: Once | INTRAMUSCULAR | Status: AC
Start: 2016-04-11 — End: 2016-04-11
  Administered 2016-04-11: 0.5 mg via INTRAVENOUS
  Filled 2016-04-11: qty 1

## 2016-04-11 NOTE — ED Provider Notes (Signed)
[facility]-EMERGENCY DEPT Provider Note   CSN: [id number] Arrival date & time: 04/11/16  1505     History   Chief Complaint Chief Complaint  Patient presents with  . Abdominal Pain    HPI Connie Arroyo is a 56 y.o. female.  HPI 55yoF with hx of anxiety presenting with N/V and back pain starting yesterday before the super bowl. She states the back pain radiates to both sides of the back. She has noticed nausea and had 2 episodes of NBNB emesis. Denies hematemesis, hematochezia, vaginal bleeding or rectal bleeding. She has noticed more pain in the RUQ. Denies chest pain or shortness of breath. Denies bowel or bladder incontinence. denies any difficulty walking. Denies perianal anesthesia. No history of cancer. Denies any falls or trauma.  Past Medical History:  Diagnosis Date  . Allergy   . Anxiety   . History of stomach ulcers     There are no active problems to display for this patient.   Past Surgical History:  Procedure Laterality Date  . ABDOMINAL HYSTERECTOMY  2002  . left shoulder surgery  2009  . TUBAL LIGATION  1986    OB History    Gravida Para Term Preterm AB Living   2 2 2     2    SAB TAB Ectopic Multiple Live Births                   Home Medications    Prior to Admission medications   Medication Sig Start Date End Date Taking? Authorizing Provider  naproxen sodium (ALEVE) 220 MG tablet Take 220-440 mg by mouth 2 (two) times daily as needed (for pain or headaches).   Yes Historical Provider, MD  Pseudoephedrine-Ibuprofen (ADVIL COLD/SINUS) 30-200 MG TABS Take 1 tablet by mouth 2 (two) times daily as needed (for cold/congestion symptoms).   Yes Historical Provider, MD  HYDROcodone-acetaminophen (NORCO/VICODIN) 5-325 MG per tablet Take 1 tablet by mouth every 8 (eight) hours as needed. Patient not taking: Reported on 04/17/2014 12/21/13   Gwenlyn Found Copland, MD  penicillin v potassium (VEETID) 500 MG tablet TAKE ONE TABLET BY MOUTH THREE TIMES DAILY Patient  not taking: Reported on 04/17/2014 01/20/14   Pearline Cables, MD    Family History Family History  Problem Relation Age of Onset  . Hypertension Mother   . Diabetes Father   . Hypertension Father   . Stroke Father   . Anuerysm Sister   . Stroke Sister   . Breast cancer Paternal Grandmother   . Breast cancer Paternal Aunt     Social History Social History  Substance Use Topics  . Smoking status: Never Smoker  . Smokeless tobacco: Never Used  . Alcohol use No     Allergies   Benadryl [diphenhydramine] and Codeine   Review of Systems Review of Systems  Constitutional: Negative for chills and fever.  HENT: Negative for ear pain and sore throat.   Eyes: Negative for pain and visual disturbance.  Respiratory: Negative for cough and shortness of breath.   Cardiovascular: Negative for chest pain and palpitations.  Gastrointestinal: Positive for abdominal pain, nausea and vomiting. Negative for blood in stool, constipation and diarrhea.  Genitourinary: Negative for dysuria and hematuria.  Musculoskeletal: Positive for back pain. Negative for arthralgias, gait problem, neck pain and neck stiffness.  Skin: Negative for color change and rash.  Neurological: Negative for seizures and syncope.  All other systems reviewed and are negative.    Physical Exam Updated Vital Signs BP  100/63   Pulse 66   Temp 97.9 F (36.6 C) (Oral)   Resp 15   SpO2 97%   Physical Exam  Constitutional: She is oriented to person, place, and time. She appears well-developed and well-nourished. No distress.  HENT:  Head: Normocephalic and atraumatic.  Eyes: Conjunctivae are normal.  Neck: Normal range of motion. Neck supple.  Cardiovascular: Normal rate, regular rhythm, S1 normal, S2 normal, normal heart sounds, intact distal pulses and normal pulses.   No murmur heard. Pulmonary/Chest: Effort normal and breath sounds normal. No respiratory distress. She has no decreased breath sounds. She has  no rhonchi.  Abdominal: Soft. There is tenderness in the right upper quadrant, epigastric area and left upper quadrant. There is no rigidity, no rebound and no guarding.  Musculoskeletal: She exhibits no edema.       Lumbar back: She exhibits decreased range of motion, tenderness and pain. She exhibits no bony tenderness, no deformity and no laceration.       Back:  Neurological: She is alert and oriented to person, place, and time. She has normal strength and normal reflexes. No cranial nerve deficit or sensory deficit. Coordination and gait normal. GCS eye subscore is 4. GCS verbal subscore is 5. GCS motor subscore is 6.  No perianal anesthesia.  Skin: Skin is warm and dry.  Psychiatric: She has a normal mood and affect.  Nursing note and vitals reviewed.    ED Treatments / Results  Labs (all labs ordered are listed, but only abnormal results are displayed) Labs Reviewed  COMPREHENSIVE METABOLIC PANEL - Abnormal; Notable for the following:       Result Value   Calcium 10.6 (*)    All other components within normal limits  CBC - Abnormal; Notable for the following:    RBC 5.23 (*)    All other components within normal limits  URINALYSIS, ROUTINE W REFLEX MICROSCOPIC - Abnormal; Notable for the following:    Ketones, ur 80 (*)    Protein, ur 30 (*)    Squamous Epithelial / LPF 0-5 (*)    All other components within normal limits  LIPASE, BLOOD    EKG  EKG Interpretation None       Radiology [place] Abdomen Limited Ruq  Result Date: 04/11/2016 CLINICAL DATA:  Mid epigastric pain with nausea vomiting EXAM: US ABDOMEN LIMITED - RIGHT UPPER QUADRANT COMPARISON:  None. FINDINGS: Gallbladder: No gallstones or wall thickening visualized. No sonographic Murphy sign noted by sonographer. Common bile duct: Diameter: 2 mm Liver: No focal lesion identified. Within normal limits in parenchymal echogenicity. IMPRESSION: Negative right upper quadrant abdominal ultrasound Electronically Signed    By: Jasmine PangKim  Fujinaga M.D.   On: 04/11/2016 21:44    Procedures Procedures (including critical care time)  Medications Ordered in ED Medications  ondansetron (ZOFRAN-ODT) 4 MG disintegrating tablet (not administered)  ondansetron (ZOFRAN-ODT) disintegrating tablet 4 mg (4 mg Oral Given 04/11/16 1701)  HYDROmorphone (DILAUDID) injection 0.5 mg (0.5 mg Intravenous Given 04/11/16 2148)  ondansetron (ZOFRAN) injection 4 mg (4 mg Intravenous Given 04/11/16 2145)  sodium chloride 0.9 % bolus 1,000 mL (0 mLs Intravenous Stopped 04/11/16 2327)     Initial Impression / Assessment and Plan / ED Course  I have reviewed the triage vital signs and the nursing notes.  Pertinent labs & imaging results that were available during my care of the patient were reviewed by me and considered in my medical decision making (see chart for details).    55yoF presenting  with back pain, N/V and abdominal pain. Sent from [facility] for concern of pyleo or kidney stones. UA with no signs of UTI or hematuria. Doubt pyleo or stones. + ketones, likely related to dehydration from vomiting and loss of appetite. Fluid bolus given. CBC, CMP lipase reassuring. RUQ [place] neg for acute chole. Back appears to be musclular in nature. No hx of HTN and no pulsitile abd mass, doubt AAA. No concerning symptoms of caudia equina. No fever, doubt epidual abscess. Discuss symptomatic tx and follow up with PCP.   Patient care discussed and supervised by my attending, Dr. Denton Lank. Azalia Bilis, MD   Final Clinical Impressions(s) / ED Diagnoses   Final diagnoses:  Right upper quadrant abdominal pain  Nausea  Non-intractable vomiting with nausea, unspecified vomiting type  Acute bilateral low back pain without sciatica    New Prescriptions New Prescriptions   No medications on file     Tesa Meadors [place] Neno Hohensee, MD 04/11/16 2348    Cathren Laine, MD 04/12/16 4098

## 2016-04-11 NOTE — ED Notes (Signed)
Called x's 3 without response 

## 2016-04-11 NOTE — ED Triage Notes (Signed)
Pt in via [facility], per report pt c/o epigastric pain & mid upper & bil lower back pain, pt reports x 1 vomiting episode today & x 3 loose stools in the last 24 hrs, pt denies dysuria, pt A&O x4

## 2016-04-12 NOTE — ED Notes (Signed)
Pt vomiting at discharge  Offered odt zofran  Pt refused  She rep[orts that she only wants to go home

## 2016-08-17 ENCOUNTER — Other Ambulatory Visit: Payer: Self-pay | Admitting: Family Medicine

## 2016-08-17 DIAGNOSIS — Z1231 Encounter for screening mammogram for malignant neoplasm of breast: Secondary | ICD-10-CM

## 2016-08-17 DIAGNOSIS — Z9889 Other specified postprocedural states: Secondary | ICD-10-CM | POA: Insufficient documentation

## 2016-08-17 DIAGNOSIS — Z8781 Personal history of (healed) traumatic fracture: Secondary | ICD-10-CM

## 2016-08-17 DIAGNOSIS — M778 Other enthesopathies, not elsewhere classified: Secondary | ICD-10-CM | POA: Insufficient documentation

## 2016-08-17 HISTORY — DX: Personal history of (healed) traumatic fracture: Z87.81

## 2016-08-17 HISTORY — DX: Other specified postprocedural states: Z98.890

## 2016-08-24 ENCOUNTER — Inpatient Hospital Stay: Admission: RE | Admit: 2016-08-24 | Payer: BLUE CROSS/BLUE SHIELD | Source: Ambulatory Visit

## 2016-09-02 ENCOUNTER — Ambulatory Visit
Admission: RE | Admit: 2016-09-02 | Discharge: 2016-09-02 | Disposition: A | Discharge status: Home / Self Care | Payer: BLUE CROSS/BLUE SHIELD | Source: Ambulatory Visit | Attending: Family Medicine | Admitting: Family Medicine

## 2016-09-02 DIAGNOSIS — Z1231 Encounter for screening mammogram for malignant neoplasm of breast: Secondary | ICD-10-CM

## 2016-10-23 ENCOUNTER — Encounter (HOSPITAL_BASED_OUTPATIENT_CLINIC_OR_DEPARTMENT_OTHER): Payer: Self-pay | Admitting: Emergency Medicine

## 2016-10-23 ENCOUNTER — Emergency Department (HOSPITAL_BASED_OUTPATIENT_CLINIC_OR_DEPARTMENT_OTHER): Payer: BLUE CROSS/BLUE SHIELD

## 2016-10-23 ENCOUNTER — Emergency Department (HOSPITAL_BASED_OUTPATIENT_CLINIC_OR_DEPARTMENT_OTHER)
Admission: EM | Admit: 2016-10-23 | Discharge: 2016-10-23 | Disposition: A | Discharge status: Home / Self Care | Payer: BLUE CROSS/BLUE SHIELD | Attending: Emergency Medicine | Admitting: Emergency Medicine

## 2016-10-23 DIAGNOSIS — Z79899 Other long term (current) drug therapy: Secondary | ICD-10-CM | POA: Diagnosis not present

## 2016-10-23 DIAGNOSIS — R42 Dizziness and giddiness: Secondary | ICD-10-CM | POA: Diagnosis present

## 2016-10-23 DIAGNOSIS — R51 Headache: Secondary | ICD-10-CM | POA: Insufficient documentation

## 2016-10-23 DIAGNOSIS — R519 Headache, unspecified: Secondary | ICD-10-CM

## 2016-10-23 HISTORY — DX: Pure hypercholesterolemia, unspecified: E78.00

## 2016-10-23 LAB — COMPREHENSIVE METABOLIC PANEL
ALBUMIN: 4.4 g/dL (ref 3.5–5.0)
ALT: 18 U/L (ref 14–54)
AST: 29 U/L (ref 15–41)
Alkaline Phosphatase: 100 U/L (ref 38–126)
Anion gap: 12 (ref 5–15)
BILIRUBIN TOTAL: 0.4 mg/dL (ref 0.3–1.2)
BUN: 12 mg/dL (ref 6–20)
CHLORIDE: 104 mmol/L (ref 101–111)
CO2: 24 mmol/L (ref 22–32)
CREATININE: 0.89 mg/dL (ref 0.44–1.00)
Calcium: 9.6 mg/dL (ref 8.9–10.3)
GFR calc Af Amer: >60 mL/min (ref >60)
GFR calc non Af Amer: >60 mL/min (ref >60)
GLUCOSE: 139 mg/dL — AB (ref 65–99)
Potassium: 3.1 mmol/L — ABNORMAL LOW (ref 3.5–5.1)
Sodium: 140 mmol/L (ref 135–145)
Total Protein: 7.9 g/dL (ref 6.5–8.1)

## 2016-10-23 LAB — CBC WITH DIFFERENTIAL/PLATELET
BASOS PCT: 0 %
Basophils Absolute: 0 10*3/uL (ref 0.0–0.1)
EOS ABS: 0.1 10*3/uL (ref 0.0–0.7)
EOS PCT: 1 %
HCT: 37 % (ref 36.0–46.0)
HEMOGLOBIN: 12.1 g/dL (ref 12.0–15.0)
Lymphocytes Relative: 38 %
Lymphs Abs: 3.1 10*3/uL (ref 0.7–4.0)
MCH: 26 pg (ref 26.0–34.0)
MCHC: 32.7 g/dL (ref 30.0–36.0)
MCV: 79.6 fL (ref 78.0–100.0)
MONOS PCT: 7 %
Monocytes Absolute: 0.5 10*3/uL (ref 0.1–1.0)
NEUTROS PCT: 54 %
Neutro Abs: 4.3 10*3/uL (ref 1.7–7.7)
PLATELETS: 222 10*3/uL (ref 150–400)
RBC: 4.65 MIL/uL (ref 3.87–5.11)
RDW: 14.9 % (ref 11.5–15.5)
WBC: 8 10*3/uL (ref 4.0–10.5)

## 2016-10-23 MED ORDER — DEXAMETHASONE SODIUM PHOSPHATE 10 MG/ML IJ SOLN
10.0000 mg | Freq: Once | INTRAMUSCULAR | Status: AC
  Administered 2016-10-23: 10 mg via INTRAVENOUS
  Filled 2016-10-23: qty 1

## 2016-10-23 MED ORDER — METOCLOPRAMIDE HCL 5 MG/ML IJ SOLN
10.0000 mg | Freq: Once | INTRAMUSCULAR | Status: AC
  Administered 2016-10-23: 10 mg via INTRAVENOUS
  Filled 2016-10-23: qty 2

## 2016-10-23 MED ORDER — SODIUM CHLORIDE 0.9 % IV BOLUS (SEPSIS)
1000.0000 mL | Freq: Once | INTRAVENOUS | Status: AC
  Administered 2016-10-23: 1000 mL via INTRAVENOUS
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
## 2016-10-23 NOTE — ED Notes (Signed)
Alert, NAD, calm, interactive, resps e/u, speaking in clear complete sentences, no dyspnea noted, skin W&D, MAEx4, VSS, c/o mid frontal HA over sinuses, (denies: other pain, sob, NVD, bleeding, palpitations/ fluttering, numbness/ tingling, dizziness at this time, weakness or visual changes), EDP into room. Family at [facility].

## 2016-10-23 NOTE — ED Notes (Signed)
Back from CT, no changes, alert, NAD, calm, interactive, speech clear.

## 2016-10-23 NOTE — ED Triage Notes (Signed)
After finishing the triage the patient states that she when she woke up this am she was "running into walls and then it eased off"  - she reports that she woke up like this, and then states that she didn't go to sleep last night
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
## 2016-10-23 NOTE — ED Notes (Addendum)
No changes, to CT via stretcher. Reports allergy to benadryl, states, "I flip out, do not give me benadryl" .

## 2016-10-31 DIAGNOSIS — G4452 New daily persistent headache (NDPH): Secondary | ICD-10-CM | POA: Insufficient documentation

## 2016-12-12 ENCOUNTER — Encounter (HOSPITAL_COMMUNITY): Payer: Self-pay

## 2017-01-10 ENCOUNTER — Other Ambulatory Visit: Payer: Self-pay

## 2017-01-10 ENCOUNTER — Emergency Department (HOSPITAL_BASED_OUTPATIENT_CLINIC_OR_DEPARTMENT_OTHER)
Admission: EM | Admit: 2017-01-10 | Discharge: 2017-01-10 | Disposition: A | Discharge status: Home / Self Care | Payer: BLUE CROSS/BLUE SHIELD | Attending: Emergency Medicine | Admitting: Emergency Medicine

## 2017-01-10 ENCOUNTER — Encounter (HOSPITAL_BASED_OUTPATIENT_CLINIC_OR_DEPARTMENT_OTHER): Payer: Self-pay | Admitting: *Deleted

## 2017-01-10 DIAGNOSIS — R519 Headache, unspecified: Secondary | ICD-10-CM

## 2017-01-10 DIAGNOSIS — I10 Essential (primary) hypertension: Secondary | ICD-10-CM | POA: Diagnosis not present

## 2017-01-10 DIAGNOSIS — Z79899 Other long term (current) drug therapy: Secondary | ICD-10-CM | POA: Insufficient documentation

## 2017-01-10 DIAGNOSIS — R51 Headache: Secondary | ICD-10-CM | POA: Insufficient documentation

## 2017-01-10 DIAGNOSIS — M542 Cervicalgia: Secondary | ICD-10-CM

## 2017-01-10 HISTORY — DX: Essential (primary) hypertension: I10

## 2017-01-10 HISTORY — DX: Pure hypercholesterolemia, unspecified: E78.00

## 2017-01-10 MED ORDER — METOCLOPRAMIDE HCL 5 MG/ML IJ SOLN
10.0000 mg | Freq: Once | INTRAMUSCULAR | Status: AC
  Administered 2017-01-10: 10 mg via INTRAVENOUS
  Filled 2017-01-10: qty 2

## 2017-01-10 MED ORDER — DEXAMETHASONE SODIUM PHOSPHATE 10 MG/ML IJ SOLN
10.0000 mg | Freq: Once | INTRAMUSCULAR | Status: AC
  Administered 2017-01-10: 10 mg via INTRAVENOUS
  Filled 2017-01-10: qty 1

## 2017-01-10 MED ORDER — KETOROLAC TROMETHAMINE 30 MG/ML IJ SOLN
30.0000 mg | Freq: Once | INTRAMUSCULAR | Status: AC
  Administered 2017-01-10: 30 mg via INTRAVENOUS
  Filled 2017-01-10: qty 1

## 2017-01-10 MED ORDER — SODIUM CHLORIDE 0.9 % IV BOLUS (SEPSIS)
1000.0000 mL | Freq: Once | INTRAVENOUS | Status: AC
  Administered 2017-01-10: 1000 mL via INTRAVENOUS

## 2017-01-10 NOTE — ED Provider Notes (Signed)
[facility] EMERGENCY DEPARTMENT Provider Note   CSN: [id number] Arrival date & time: 01/10/17  1251     History   Chief Complaint Chief Complaint  Patient presents with  . Headache    HPI Connie Arroyo is a 56 y.o. female with history of hypertension, cholesterol, anxiety, Diagnosis migraines who presents with a one-week history of headache.  Her headaches began after she stopped taking her medications, including her Depakote and Lexapro that she takes for her migraines.  She stopped taking her medications because they were making her sleep until 2 or 3 in the afternoon.  She was missing work.  She has not taken her medications since then.  She is not taking any other medications for headache since it started.  She describes her headache as typical of her migraine type headaches she has been getting for the past several months.  She describes it as frontal and in her neck.  She denies any nausea, vomiting, photophobia, visual changes, numbness or tingling.  HPI  Past Medical History:  Diagnosis Date  . Allergy   . Anxiety   . High cholesterol   . History of stomach ulcers   . Hypercholesteremia   . Hypertension     There are no active problems to display for this patient.   Past Surgical History:  Procedure Laterality Date  . ABDOMINAL HYSTERECTOMY  2002  . left shoulder surgery  2009  . TUBAL LIGATION  1986    OB History    Gravida Para Term Preterm AB Living   2 2 2     2    SAB TAB Ectopic Multiple Live Births                   Home Medications    Prior to Admission medications   Medication Sig Start Date End Date Taking? Authorizing Provider  ATENOLOL PO Take by mouth.   Yes [provider]  divalproex (DEPAKOTE) 500 MG DR tablet Take 500 mg 3 (three) times daily by mouth.   Yes [provider]  eletriptan (RELPAX) 40 MG tablet Take 40 mg as needed by mouth for migraine or headache. May repeat in 2 hours if headache persists  or recurs.   Yes [provider]  escitalopram (LEXAPRO) 10 MG tablet Take 10 mg daily by mouth.   Yes [provider]  pantoprazole (PROTONIX) 20 MG tablet Take 20 mg daily by mouth.   Yes [provider]  PREDNISONE PO Take by mouth.   Yes [provider]  rosuvastatin (CRESTOR) 5 MG tablet Take 5 mg daily by mouth.   Yes [provider]  naproxen sodium (ALEVE) 220 MG tablet Take 220-440 mg by mouth 2 (two) times daily as needed (for pain or headaches).    [provider]  Pseudoephedrine-Ibuprofen (ADVIL COLD/SINUS) 30-200 MG TABS Take 1 tablet by mouth 2 (two) times daily as needed (for cold/congestion symptoms).    [provider]    Family History Family History  Problem Relation Age of Onset  . Hypertension Mother   . Diabetes Father   . Hypertension Father   . Stroke Father   . Anuerysm Sister   . Stroke Sister   . Breast cancer Paternal Grandmother   . Breast cancer Paternal Aunt     Social History Social History   Tobacco Use  . Smoking status: Never Smoker  . Smokeless tobacco: Never Used  Substance Use Topics  . Alcohol use: No  .  Drug use: No     Allergies   Benadryl [diphenhydramine] and Codeine   Review of Systems Review of Systems  Constitutional: Negative for chills and fever.  HENT: Negative for facial swelling and sore throat.   Respiratory: Negative for shortness of breath.   Cardiovascular: Negative for chest pain.  Gastrointestinal: Negative for abdominal pain, nausea and vomiting.  Genitourinary: Negative for dysuria.  Musculoskeletal: Negative for back pain.  Skin: Negative for rash and wound.  Neurological: Positive for headaches. Negative for dizziness, weakness, light-headedness and numbness.  Psychiatric/Behavioral: The patient is not nervous/anxious.      Physical Exam Updated Vital Signs BP 115/74 (BP Location: Right Arm)   Pulse 69   Temp 97.7 F (36.5 C) (Oral)    Resp 16   Ht 5\' 4"  (1.626 m)   Wt 74.8 kg (165 lb)   SpO2 100%   BMI 28.32 kg/m   Physical Exam  Constitutional: She appears well-developed and well-nourished. No distress.  HENT:  Head: Normocephalic and atraumatic.  Mouth/Throat: Oropharynx is clear and moist. No oropharyngeal exudate.  Eyes: Conjunctivae and EOM are normal. Pupils are equal, round, and reactive to light. Right eye exhibits no discharge. Left eye exhibits no discharge. No scleral icterus.  Neck: Normal range of motion. Neck supple. No thyromegaly present.  Some midline tenderness in the neck, however patient able to put her chin to chest without difficulty  Cardiovascular: Normal rate, regular rhythm, normal heart sounds and intact distal pulses. Exam reveals no gallop and no friction rub.  No murmur heard. Pulmonary/Chest: Effort normal and breath sounds normal. No stridor. No respiratory distress. She has no wheezes. She has no rales.  Abdominal: Soft. Bowel sounds are normal. She exhibits no distension. There is no tenderness. There is no rebound and no guarding.  Musculoskeletal: She exhibits no edema.  Lymphadenopathy:    She has no cervical adenopathy.  Neurological: She is alert. Coordination normal.  CN 3-12 intact; normal sensation throughout; 5/5 strength in all 4 extremities; equal bilateral grip strength; no ataxia on finger to nose  Skin: Skin is warm and dry. No rash noted. She is not diaphoretic. No pallor.  Psychiatric: She has a normal mood and affect.  Nursing note and vitals reviewed.    ED Treatments / Results  Labs (all labs ordered are listed, but only abnormal results are displayed) Labs Reviewed - No data to display  EKG  EKG Interpretation  Date/Time:  Tuesday January 10 2017 13:27:00 EST Ventricular Rate:  73 PR Interval:    QRS Duration: 63 QT Interval:  499 QTC Calculation: 550 R Axis:   26 Text Interpretation:  Sinus rhythm Probable left atrial enlargement Low voltage,  precordial leads Abnormal R-wave progression, early transition Prolonged QT interval Baseline wander in lead(s) V6 Confirmed by Tilden Fossaees, Elizabeth [phone]([id number]) on 01/10/2017 1:36:56 PM       Radiology No results found.  Procedures Procedures (including critical care time)  Medications Ordered in ED Medications  sodium chloride 0.9 % bolus 1,000 mL (1,000 mLs Intravenous New Bag/Given 01/10/17 1549)  ketorolac (TORADOL) 30 MG/ML injection 30 mg (30 mg Intravenous Given 01/10/17 1549)  metoCLOPramide (REGLAN) injection 10 mg (10 mg Intravenous Given 01/10/17 1549)  dexamethasone (DECADRON) injection 10 mg (10 mg Intravenous Given 01/10/17 1549)     Initial Impression / Assessment and Plan / ED Course  I have reviewed the triage vital signs and the nursing notes.  Pertinent labs & imaging results that were available during my care  of the patient were reviewed by me and considered in my medical decision making (see chart for details).     Pt HA treated and improved while in ED.  Presentation is like pts typical HA and non concerning for [facility], ICH, Meningitis, or temporal arteritis. Pt is afebrile with no focal neuro deficits, nuchal rigidity, or change in vision. Pt is to follow up with PCP to discuss medications.  Taking all the medications at night has caused the patient to significantly over sleep and thus has had a headache since she has not been taking the medications.  Advised patient to call PCP first thing tomorrow to discuss this.  Considering 1 week away from medications, I did not restart them for this evening.  According to nursing, patient had a low heart rate 32 beats per minutes in triage.  I have not seen her heart rate go below 55 on all my evaluations.  Patient does have a few, intermittent PVCs.  12-lead EKG shows NSR, prolonged QTc at 550.  Pt verbalizes understanding and is agreeable with plan to dc. I discussed patient case with Dr. Jacqulyn BathLong who guided the patient's management and agrees  with plan.    Final Clinical Impressions(s) / ED Diagnoses   Final diagnoses:  Bad headache  Neck pain    ED Discharge Orders    None       Emi HolesLaw, Sergei Delo M, PA-C 01/10/17 Marisa [id number]    Long, Joshua G, MD 01/11/17 1249

## 2017-01-10 NOTE — Discharge Instructions (Signed)
Please call your doctor tomorrow morning for further evaluation and treatment and any further changes in your medications.  Please let them know that you had a low heart rate documented momentarily here today and you had very intermittent PVC on your heart monitoring. Please follow-up with your neurologist for further evaluation and treatment of your ongoing migraines.  Please return the emergency department if you develop any new or worsening symptoms.

## 2017-01-10 NOTE — ED Notes (Signed)
While at triage pts heart rate dropped to 32. She has an irregular heart beat that is intermittent.

## 2017-01-10 NOTE — ED Triage Notes (Signed)
Headache x 1 week. She is being followed by a neurologist for the headaches and has been started on new medications.

## 2017-02-20 ENCOUNTER — Encounter (HOSPITAL_COMMUNITY): Payer: Self-pay

## 2017-03-22 ENCOUNTER — Other Ambulatory Visit: Payer: Self-pay

## 2017-03-22 ENCOUNTER — Encounter (HOSPITAL_BASED_OUTPATIENT_CLINIC_OR_DEPARTMENT_OTHER): Payer: Self-pay | Admitting: Emergency Medicine

## 2017-03-22 ENCOUNTER — Emergency Department (HOSPITAL_BASED_OUTPATIENT_CLINIC_OR_DEPARTMENT_OTHER)
Admission: EM | Admit: 2017-03-22 | Discharge: 2017-03-22 | Disposition: A | Discharge status: Home / Self Care | Payer: BLUE CROSS/BLUE SHIELD | Attending: Physician Assistant | Admitting: Physician Assistant

## 2017-03-22 DIAGNOSIS — Z79899 Other long term (current) drug therapy: Secondary | ICD-10-CM | POA: Diagnosis not present

## 2017-03-22 DIAGNOSIS — R51 Headache: Secondary | ICD-10-CM | POA: Diagnosis present

## 2017-03-22 DIAGNOSIS — G43709 Chronic migraine without aura, not intractable, without status migrainosus: Secondary | ICD-10-CM | POA: Diagnosis not present

## 2017-03-22 DIAGNOSIS — I10 Essential (primary) hypertension: Secondary | ICD-10-CM | POA: Diagnosis not present

## 2017-03-22 DIAGNOSIS — IMO0002 Reserved for concepts with insufficient information to code with codable children: Secondary | ICD-10-CM

## 2017-03-22 NOTE — ED Provider Notes (Signed)
[facility] EMERGENCY DEPARTMENT Provider Note   CSN: [id number] Arrival date & time: 03/22/17  1147     History   Chief Complaint Chief Complaint  Patient presents with  . NO NEW C/O TODAY, HEADACHE    HPI Connie Arroyo is a 57 y.o. female.  HPI   Patient is a 57 year old female presenting with not liking her primary neurologist.  Essentially patient has had 2-3 months of headaches.  She seen by neuro neurologist who has been tried on atenolol, Depakote, and now is on Topamax.  She does not like with Topamax makes her feel so that he is trying a fourth choice.  She feels like the neurologist does not listen to what she saying.  She had multiple "episodes of falling out".  It sounds as if she is had multiple episodes of falling out due to headache.  She is been seen by her neurologist and had a negative CAT scan.  She is upset because they keep "changing my type of headache".  Patient has no headache today.  It is unclear what brought her here today.  She does not want treatment for headache.  Nor she does she want labs.  We offered labs and she had "fallen out" simply recently.  But she does not want [place] to send them at this time.  Patient is concerned because she had to take a lot of time out of work for these headaches.  Past Medical History:  Diagnosis Date  . Allergy   . Anxiety   . High cholesterol   . History of stomach ulcers   . Hypercholesteremia   . Hypertension     There are no active problems to display for this patient.   Past Surgical History:  Procedure Laterality Date  . ABDOMINAL HYSTERECTOMY  2002  . left shoulder surgery  2009  . TUBAL LIGATION  1986    OB History    Gravida Para Term Preterm AB Living   2 2 2     2    SAB TAB Ectopic Multiple Live Births                   Home Medications    Prior to Admission medications   Medication Sig Start Date End Date Taking? Authorizing Provider  ATENOLOL PO Take by mouth.     [provider]  divalproex (DEPAKOTE) 500 MG DR tablet Take 500 mg 3 (three) times daily by mouth.    [provider]  eletriptan (RELPAX) 40 MG tablet Take 40 mg as needed by mouth for migraine or headache. May repeat in 2 hours if headache persists or recurs.    [provider]  escitalopram (LEXAPRO) 10 MG tablet Take 10 mg daily by mouth.    [provider]  naproxen sodium (ALEVE) 220 MG tablet Take 220-440 mg by mouth 2 (two) times daily as needed (for pain or headaches).    [provider]  pantoprazole (PROTONIX) 20 MG tablet Take 20 mg daily by mouth.    [provider]  PREDNISONE PO Take by mouth.    [provider]  Pseudoephedrine-Ibuprofen (ADVIL COLD/SINUS) 30-200 MG TABS Take 1 tablet by mouth 2 (two) times daily as needed (for cold/congestion symptoms).    [provider]  rosuvastatin (CRESTOR) 5 MG tablet Take 5 mg daily by mouth.    [provider]    Family History Family History  Problem Relation Age of Onset  . Hypertension  Mother   . Diabetes Father   . Hypertension Father   . Stroke Father   . Anuerysm Sister   . Stroke Sister   . Breast cancer Paternal Grandmother   . Breast cancer Paternal Aunt     Social History Social History   Tobacco Use  . Smoking status: Never Smoker  . Smokeless tobacco: Never Used  Substance Use Topics  . Alcohol use: No  . Drug use: No     Allergies   Benadryl [diphenhydramine] and Codeine   Review of Systems Review of Systems  Constitutional: Negative for activity change.  Respiratory: Negative for shortness of breath.   Cardiovascular: Negative for chest pain.  Gastrointestinal: Negative for abdominal pain.  Neurological: Positive for syncope and headaches.     Physical Exam Updated Vital Signs BP 103/77 (BP Location: Right Arm)   Pulse 66   Temp 98.2 F (36.8 C) (Oral)   Resp 18   Ht 5\' 4"  (1.626 m)   Wt 67.1 kg (148 lb)    SpO2 100%   BMI 25.40 kg/m   Physical Exam  Constitutional: She is oriented to person, place, and time. She appears well-developed and well-nourished.  HENT:  Head: Normocephalic and atraumatic.  Eyes: Right eye exhibits no discharge. Left eye exhibits no discharge.  Cardiovascular: Normal rate, regular rhythm and normal heart sounds.  No murmur heard. Pulmonary/Chest: Effort normal and breath sounds normal. She has no wheezes. She has no rales.  Abdominal: Soft. She exhibits no distension. There is no tenderness.  Neurological: She is oriented to person, place, and time. No cranial nerve deficit.  Skin: Skin is warm and dry. She is not diaphoretic.  Psychiatric: She has a normal mood and affect.  Nursing note and vitals reviewed.    ED Treatments / Results  Labs (all labs ordered are listed, but only abnormal results are displayed) Labs Reviewed - No data to display  EKG  EKG Interpretation None       Radiology No results found.  Procedures Procedures (including critical care time)  Medications Ordered in ED Medications - No data to display   Initial Impression / Assessment and Plan / ED Course  I have reviewed the triage vital signs and the nursing notes.  Pertinent labs & imaging results that were available during my care of the patient were reviewed by me and considered in my medical decision making (see chart for details).    Patient is a 57 year old female presenting with not liking her primary neurologist.  Essentially patient has had 2-3 months of headaches.  She seen by neuro neurologist who has been tried on atenolol, Depakote, and now is on Topamax.  She does not like with Topamax makes her feel so that he is trying a fourth choice.  She feels like the neurologist does not listen to what she saying.  She had multiple "episodes of falling out".  It sounds as if she is had multiple episodes of falling out due to headache.  She is been seen by her neurologist  and had a negative CAT scan.  She is upset because they keep "changing my type of headache".  Patient has no headache today.  It is unclear what brought her here today.  She does not want treatment for headache.  Nor she does she want labs.  We offered labs and she had "fallen out" simply recently.  But she does not want us to send them at this time.  Patient is concerned because  she had to take a lot of time out of work for these headaches.  2:18 PM We offered labs which patient refused.  I do not know what else to offer patient except referral to a different neurologist to see if the have a better relationship.  Reluctant to make any changes to her headache regimen given that this is really the realm of a specialist.  Final Clinical Impressions(s) / ED Diagnoses   Final diagnoses:  None    ED Discharge Orders    None       Abelino Derrick, MD 03/22/17 1418

## 2017-03-22 NOTE — Discharge Instructions (Signed)
Please follow-up with neurologist.  We have given you another office to call in case you can establish care with them.

## 2017-03-22 NOTE — ED Triage Notes (Signed)
Pt reports migraine ha x august, states she is not happy with her neurologist "he just keeps giving me medicines that don't work". Pt reports syncopal episode yesterday, and when he mother called her this morning and she told her about that, he mother told her to come to ed to get checked out. Pt denies any c/o at this time, "except my headache might be trying to come back." pt states she has had a headache daily since august and this is no different than her usual daily headache. Pt amb from ems stretcher to wheelchair, smiling in nad.

## 2017-03-22 NOTE — ED Notes (Signed)
NAD at this time. Pt is stable and going home.  

## 2017-03-22 NOTE — ED Notes (Signed)
I went in to assess pt, she was fully dressed and stated that she is leaving. She says that she is having migraines and there is nothing they can do for her here.  She stated that the MD was discharging her.

## 2017-03-22 NOTE — ED Notes (Signed)
ALL PREVIOUS NOTES BY THIS RN, NOT Jeannetta EllisJOHN HAYNES, EMT.

## 2017-07-17 MED FILL — MEMANTINE HCL 10 MG TABLET: 10 | 30 days supply | Qty: 60 | Fill #0

## 2017-07-17 MED FILL — ESCITALOPRAM 10 MG TABLET: 10 | 30 days supply | Qty: 30 | Fill #0

## 2017-07-17 MED FILL — ROSUVASTATIN CALCIUM 5 MG T: 5 | 30 days supply | Qty: 30 | Fill #0

## 2017-08-16 MED FILL — ROSUVASTATIN CALCIUM 5 MG T: 5 | 30 days supply | Qty: 30 | Fill #1

## 2017-08-16 MED FILL — MEMANTINE HCL 10 MG TABLET: 10 | 30 days supply | Qty: 60 | Fill #1

## 2017-08-25 ENCOUNTER — Other Ambulatory Visit: Payer: Self-pay | Admitting: Family Medicine

## 2017-09-18 MED FILL — ROSUVASTATIN CALCIUM 5 MG T: 5 | 30 days supply | Qty: 30 | Fill #2

## 2017-10-10 ENCOUNTER — Other Ambulatory Visit: Payer: Self-pay | Admitting: Obstetrics and Gynecology

## 2017-10-10 DIAGNOSIS — Z1231 Encounter for screening mammogram for malignant neoplasm of breast: Secondary | ICD-10-CM

## 2017-10-10 MED FILL — MEMANTINE HCL 10 MG TABLET: 10 | 30 days supply | Qty: 60 | Fill #0 | Status: TO

## 2017-10-27 MED FILL — ROSUVASTATIN CALCIUM 5 MG T: 5 | 90 days supply | Qty: 90 | Fill #0

## 2017-11-01 ENCOUNTER — Ambulatory Visit: Payer: Self-pay | Attending: Nurse Practitioner | Admitting: Nurse Practitioner

## 2017-11-01 ENCOUNTER — Encounter: Payer: Self-pay | Admitting: Nurse Practitioner

## 2017-11-01 VITALS — BP 126/83 | HR 64 | Temp 97.9°F | Ht 64.0 in | Wt 151.6 lb

## 2017-11-01 DIAGNOSIS — I10 Essential (primary) hypertension: Secondary | ICD-10-CM | POA: Insufficient documentation

## 2017-11-01 DIAGNOSIS — G44229 Chronic tension-type headache, not intractable: Secondary | ICD-10-CM | POA: Insufficient documentation

## 2017-11-01 DIAGNOSIS — E7800 Pure hypercholesterolemia, unspecified: Secondary | ICD-10-CM | POA: Insufficient documentation

## 2017-11-01 DIAGNOSIS — Z23 Encounter for immunization: Secondary | ICD-10-CM

## 2017-11-01 DIAGNOSIS — R222 Localized swelling, mass and lump, trunk: Secondary | ICD-10-CM

## 2017-11-01 DIAGNOSIS — F419 Anxiety disorder, unspecified: Secondary | ICD-10-CM | POA: Insufficient documentation

## 2017-11-01 DIAGNOSIS — Z79899 Other long term (current) drug therapy: Secondary | ICD-10-CM | POA: Insufficient documentation

## 2017-11-01 DIAGNOSIS — Z8719 Personal history of other diseases of the digestive system: Secondary | ICD-10-CM | POA: Insufficient documentation

## 2017-11-01 DIAGNOSIS — R7303 Prediabetes: Secondary | ICD-10-CM | POA: Insufficient documentation

## 2017-11-01 DIAGNOSIS — K219 Gastro-esophageal reflux disease without esophagitis: Secondary | ICD-10-CM | POA: Insufficient documentation

## 2017-11-01 DIAGNOSIS — Z7982 Long term (current) use of aspirin: Secondary | ICD-10-CM | POA: Insufficient documentation

## 2017-11-01 LAB — GLUCOSE, POCT (MANUAL RESULT ENTRY): POC GLUCOSE: 88 mg/dL (ref 70–99)

## 2017-11-01 LAB — POCT GLYCOSYLATED HEMOGLOBIN (HGB A1C): Hemoglobin A1C: 5.4 % (ref 4.0–5.6)

## 2017-11-01 MED ORDER — PANTOPRAZOLE SODIUM 20 MG PO TBEC: 20.0000 mg | DELAYED_RELEASE_TABLET | Freq: Every day | ORAL | 1 refills | Status: DC

## 2017-11-01 MED ORDER — NAPROXEN 500 MG PO TABS: 500.0000 mg | ORAL_TABLET | Freq: Two times a day (BID) | ORAL | 3 refills | Status: AC

## 2017-11-01 MED ORDER — ESCITALOPRAM OXALATE 10 MG PO TABS: 10.0000 mg | ORAL_TABLET | Freq: Every day | ORAL | 1 refills | Status: DC

## 2017-11-01 MED ORDER — ELETRIPTAN HYDROBROMIDE 40 MG PO TABS: 40.0000 mg | ORAL_TABLET | ORAL | 1 refills | Status: DC | PRN

## 2017-11-01 MED FILL — PANTOPRAZOLE SOD DR 20 MG T: 20 | 30 days supply | Qty: 30 | Fill #0

## 2017-11-01 MED FILL — NAPROXEN 500 MG TABLET: 500 | 30 days supply | Qty: 60 | Fill #0

## 2017-11-01 MED FILL — ESCITALOPRAM 10 MG TABLET: 10 | 30 days supply | Qty: 30 | Fill #0

## 2017-11-01 MED FILL — ELETRIPTAN HYDROBROMIDE 40: 40 | 30 days supply | Qty: 6 | Fill #0

## 2017-11-01 NOTE — Progress Notes (Signed)
Assessment & Plan:  Connie Arroyo was seen today for new patient (initial visit).  Diagnoses and all orders for this visit:  Chronic tension-type headache, not intractable -     eletriptan (RELPAX) 40 MG tablet; Take 1 tablet (40 mg total) by mouth as needed for migraine or headache. May repeat in 2 hours if headache persists or recurs. -     naproxen (NAPROSYN) 500 MG tablet; Take 1 tablet (500 mg total) by mouth 2 (two) times daily with a meal.  Chest wall mass Mammogram scheduled for 11-21-2017  Prediabetes Chronic, Improving. Diet controlled.  -     Glucose (CBG) -     HgB A1c -     CBC -     CMP14+EGFR -     Lipid panel -     TSH Continue blood sugar control as discussed in office today, low carbohydrate diet, and regular physical exercise as tolerated, 150 minutes per week (30 min each day, 5 days per week, or 50 min 3 days per week). K Annual eye exams and foot exams are recommended. Lab Results  Component Value Date   HGBA1C 5.4 11/01/2017     Need for immunization against influenza -     Flu Vaccine QUAD 36+ mos IM  Anxiety -     escitalopram (LEXAPRO) 10 MG tablet; Take 1 tablet (10 mg total) by mouth daily.  Gastroesophageal reflux disease, esophagitis presence not specified -     pantoprazole (PROTONIX) 20 MG tablet; Take 1 tablet (20 mg total) by mouth daily.    Patient has been counseled on age-appropriate routine health concerns for screening and prevention. These are reviewed and up-to-date. Referrals have been placed accordingly. Immunizations are up-to-date or declined.    Subjective:   Chief Complaint  Patient presents with  . New Patient (Initial Visit)    Patient is here to establish care for hypertension.    HPI Connie Arroyo 57 y.o. female presents to office today to establish care. She has a history of prediabetes, chronic tension headaches, GERD and Essential Hypertension however she can not recall the name of the medication she was prescribed  in the past. Her blood pressure is not elevated today. I will not start any antihypertensives at this time.   Chest Wall mass She has a soft tissue mass arising from the center of her chest. She has a mammogram scheduled with the breast clinic. If no definitive diagnosis from mammogram she will need a soft tissue ultrasound.   Tension Headaches She has seen Neurology in the past. Most recently a month ago. She had complaints of dizziness and persistent headaches some of which were occurring multiple times per day. MRI of brain was negative for aneurysm per Neurology. She had an EEG ordered however she canceled it due to [facility] costs. She is currently taking Relpax, namenda and lexapro for anxiety. Her most recent diagnosis by Neurology was muscle tension headache phenomenon and migraines. It was recommended that patient wean herself off namenda and if headaches worsened she could possibly benefit from amitriptyline or nortriptyline. She is requesting to continue namenda and she endorses increased relief of her headaches with taking.      Review of Systems  Constitutional: Negative for fever, malaise/fatigue and weight loss.  HENT: Negative.  Negative for nosebleeds.   Eyes: Negative.  Negative for blurred vision, double vision and photophobia.  Respiratory: Negative.  Negative for cough and shortness of breath.   Cardiovascular: Negative.  Negative for chest  pain, palpitations and leg swelling.  Gastrointestinal: Positive for heartburn. Negative for nausea and vomiting.  Musculoskeletal: Negative.  Negative for myalgias.  Neurological: Positive for dizziness and headaches. Negative for focal weakness and seizures.  Psychiatric/Behavioral: Negative for suicidal ideas. The patient is nervous/anxious.     Past Medical History:  Diagnosis Date  . Allergy   . Anxiety   . High cholesterol   . History of stomach ulcers   . Hypercholesteremia   . Hypertension     Past Surgical History:    Procedure Laterality Date  . ABDOMINAL HYSTERECTOMY  2002  . left shoulder surgery  2009  . TUBAL LIGATION  1986    Family History  Problem Relation Age of Onset  . Hypertension Mother   . Diabetes Father   . Hypertension Father   . Stroke Father   . Anuerysm Sister   . Stroke Sister   . Breast cancer Paternal Grandmother   . Breast cancer Paternal Aunt     Social History Reviewed with no changes to be made today.   Outpatient Medications Prior to Visit  Medication Sig Dispense Refill  . aspirin 81 MG tablet Take 81 mg by mouth daily.    . IRON PO Take by mouth.    . memantine (NAMENDA) 10 MG tablet Take 10 mg by mouth 2 (two) times daily.    . rosuvastatin (CRESTOR) 5 MG tablet Take 5 mg daily by mouth.    . eletriptan (RELPAX) 40 MG tablet Take 40 mg as needed by mouth for migraine or headache. May repeat in 2 hours if headache persists or recurs.    Marland Kitchen escitalopram (LEXAPRO) 10 MG tablet Take 10 mg daily by mouth.    . naproxen sodium (ALEVE) 220 MG tablet Take 220-440 mg by mouth 2 (two) times daily as needed (for pain or headaches).    . pantoprazole (PROTONIX) 20 MG tablet Take 20 mg daily by mouth.    . divalproex (DEPAKOTE) 500 MG DR tablet Take 500 mg 3 (three) times daily by mouth.    Marland Kitchen PREDNISONE PO Take by mouth.    . ATENOLOL PO Take by mouth.    . Pseudoephedrine-Ibuprofen (ADVIL COLD/SINUS) 30-200 MG TABS Take 1 tablet by mouth 2 (two) times daily as needed (for cold/congestion symptoms).     No facility-administered medications prior to visit.     Allergies  Allergen Reactions  . Benadryl [Diphenhydramine] Other (See Comments)    Agitation and hallucinations  . Codeine Nausea And Vomiting    No rash, swelling or other allergic sx  . Penicillins        Objective:    BP 126/83 (BP Location: Left Arm, Patient Position: Sitting, Cuff Size: Normal)   Pulse 64   Temp 97.9 F (36.6 C) (Oral)   Ht 5' 4"  (1.626 m)   Wt 151 lb 9.6 oz (68.8 kg)   SpO2  100%   BMI 26.02 kg/m  Wt Readings from Last 3 Encounters:  11/01/17 151 lb 9.6 oz (68.8 kg)  03/22/17 148 lb (67.1 kg)  01/10/17 165 lb (74.8 kg)    Physical Exam  Constitutional: She is oriented to person, place, and time. She appears well-developed and well-nourished. She is cooperative.  HENT:  Head: Normocephalic and atraumatic.  Eyes: EOM are normal.  Neck: Normal range of motion.  Cardiovascular: Normal rate, regular rhythm and normal heart sounds. Exam reveals no gallop and no friction rub.  No murmur heard. Pulmonary/Chest: Effort normal and breath  sounds normal. No tachypnea. No respiratory distress. She has no decreased breath sounds. She has no wheezes. She has no rhonchi. She has no rales. She exhibits no tenderness.  Abdominal: Bowel sounds are normal.  Musculoskeletal: Normal range of motion. She exhibits no edema.  Neurological: She is alert and oriented to person, place, and time. She has normal strength. No cranial nerve deficit or sensory deficit. She displays a negative Romberg sign. Coordination and gait normal.  Skin: Skin is warm and dry.  Psychiatric: She has a normal mood and affect. Her speech is normal and behavior is normal. Judgment and thought content normal. Cognition and memory are normal. She expresses no homicidal and no suicidal ideation. She expresses no suicidal plans and no homicidal plans.  Nursing note and vitals reviewed.       Patient has been counseled extensively about nutrition and exercise as well as the importance of adherence with medications and regular follow-up. The patient was given clear instructions to go to ER or return to medical center if symptoms don't improve, worsen or new problems develop. The patient verbalized understanding.   Follow-up: 2 week f/u for BP recheck  Gildardo Pounds, FNP-BC [facility] [place], [place]   11/07/2017, 10:58 PM

## 2017-11-01 NOTE — Patient Instructions (Signed)

## 2017-11-02 LAB — CBC
HEMOGLOBIN: 12.9 g/dL (ref 11.1–15.9)
Hematocrit: 41.4 % (ref 34.0–46.6)
MCH: 25.7 pg — AB (ref 26.6–33.0)
MCHC: 31.2 g/dL — ABNORMAL LOW (ref 31.5–35.7)
MCV: 83 fL (ref 79–97)
Platelets: 248 10*3/uL (ref 150–450)
RBC: 5.01 x10E6/uL (ref 3.77–5.28)
RDW: 13.5 % (ref 12.3–15.4)
WBC: 6.3 10*3/uL (ref 3.4–10.8)

## 2017-11-02 LAB — CMP14+EGFR
ALBUMIN: 4.6 g/dL (ref 3.5–5.5)
ALT: 33 IU/L — ABNORMAL HIGH (ref 0–32)
AST: 30 IU/L (ref 0–40)
Albumin/Globulin Ratio: 1.4 (ref 1.2–2.2)
Alkaline Phosphatase: 109 IU/L (ref 39–117)
BUN / CREAT RATIO: 13 (ref 9–23)
BUN: 11 mg/dL (ref 6–24)
Bilirubin Total: 0.3 mg/dL (ref 0.0–1.2)
CHLORIDE: 101 mmol/L (ref 96–106)
CO2: 25 mmol/L (ref 20–29)
Calcium: 10.4 mg/dL — ABNORMAL HIGH (ref 8.7–10.2)
Creatinine, Ser: 0.86 mg/dL (ref 0.57–1.00)
GFR calc Af Amer: 86 mL/min/{1.73_m2} (ref >59)
GFR calc non Af Amer: 75 mL/min/{1.73_m2} (ref >59)
GLUCOSE: 92 mg/dL (ref 65–99)
Globulin, Total: 3.2 g/dL (ref 1.5–4.5)
Potassium: 3.9 mmol/L (ref 3.5–5.2)
SODIUM: 143 mmol/L (ref 134–144)
Total Protein: 7.8 g/dL (ref 6.0–8.5)

## 2017-11-02 LAB — LIPID PANEL
Chol/HDL Ratio: 2.5 ratio (ref 0.0–4.4)
Cholesterol, Total: 215 mg/dL — ABNORMAL HIGH (ref 100–199)
HDL: 87 mg/dL (ref >39)
LDL Calculated: 108 mg/dL — ABNORMAL HIGH (ref 0–99)
Triglycerides: 98 mg/dL (ref 0–149)
VLDL CHOLESTEROL CAL: 20 mg/dL (ref 5–40)

## 2017-11-02 LAB — TSH: TSH: 2.37 u[IU]/mL (ref 0.450–4.500)

## 2017-11-07 ENCOUNTER — Encounter: Payer: Self-pay | Admitting: Nurse Practitioner

## 2017-11-07 MED ORDER — ROSUVASTATIN CALCIUM 5 MG PO TABS
5.0000 mg | ORAL_TABLET | Freq: Every day | ORAL | 1 refills | Status: DC
Start: 2017-11-07 — End: 2018-12-06

## 2017-11-08 ENCOUNTER — Telehealth: Payer: Self-pay

## 2017-11-08 NOTE — Telephone Encounter (Signed)
CMA spoke to patient to inform on lab results. Patient is aware to pick up her Rx.   Pt. Verified DOB. Pt. Understood.

## 2017-11-08 NOTE — Telephone Encounter (Signed)
-----   Message from Claiborne Rigg, NP sent at 11/07/2017 11:19 PM EDT ----- Cholesterol levels are elevated. Will need to continue crestor which was ordered for you last year. Will refill crestor and send to pharmacy. Thyroid levels are normal. All other labs are essentially normal and do not require additional testing at this time.

## 2017-11-14 MED FILL — MEMANTINE HCL 10 MG TABLET: 10 | 30 days supply | Qty: 60 | Fill #0

## 2017-11-15 ENCOUNTER — Encounter: Payer: Self-pay | Admitting: Pharmacist

## 2017-11-21 ENCOUNTER — Ambulatory Visit (HOSPITAL_COMMUNITY): Payer: Self-pay

## 2017-11-22 ENCOUNTER — Telehealth (HOSPITAL_COMMUNITY): Payer: Self-pay | Admitting: *Deleted

## 2017-11-22 NOTE — Telephone Encounter (Signed)
Patient missed her [facility] appointment yesterday and called to reschedule. Let patient know that she if she is unable to make her appointment to call 24-hours in advance to let us know. Told patient if she no [place]ows the next appointment that she may not be eligible for the program due to due no shows. Patient is rescheduled for Tuesday, February 06, 2018 at 1400. Explained to patient that appointment will be at [facility]. Patient verbalized understanding.

## 2017-12-05 ENCOUNTER — Encounter: Payer: Self-pay | Admitting: Nurse Practitioner

## 2017-12-05 ENCOUNTER — Ambulatory Visit: Payer: Self-pay | Attending: Nurse Practitioner | Admitting: Nurse Practitioner

## 2017-12-05 VITALS — BP 129/89 | HR 63 | Temp 98.8°F | Ht 64.0 in | Wt 146.6 lb

## 2017-12-05 DIAGNOSIS — Z90710 Acquired absence of both cervix and uterus: Secondary | ICD-10-CM | POA: Insufficient documentation

## 2017-12-05 DIAGNOSIS — Z880 Allergy status to penicillin: Secondary | ICD-10-CM | POA: Insufficient documentation

## 2017-12-05 DIAGNOSIS — I10 Essential (primary) hypertension: Secondary | ICD-10-CM | POA: Insufficient documentation

## 2017-12-05 DIAGNOSIS — E7800 Pure hypercholesterolemia, unspecified: Secondary | ICD-10-CM | POA: Insufficient documentation

## 2017-12-05 DIAGNOSIS — Z8711 Personal history of peptic ulcer disease: Secondary | ICD-10-CM | POA: Insufficient documentation

## 2017-12-05 DIAGNOSIS — G44229 Chronic tension-type headache, not intractable: Secondary | ICD-10-CM | POA: Insufficient documentation

## 2017-12-05 DIAGNOSIS — M79602 Pain in left arm: Secondary | ICD-10-CM | POA: Insufficient documentation

## 2017-12-05 DIAGNOSIS — F419 Anxiety disorder, unspecified: Secondary | ICD-10-CM | POA: Insufficient documentation

## 2017-12-05 DIAGNOSIS — Z885 Allergy status to narcotic agent status: Secondary | ICD-10-CM | POA: Insufficient documentation

## 2017-12-05 DIAGNOSIS — Z823 Family history of stroke: Secondary | ICD-10-CM | POA: Insufficient documentation

## 2017-12-05 DIAGNOSIS — Z833 Family history of diabetes mellitus: Secondary | ICD-10-CM | POA: Insufficient documentation

## 2017-12-05 DIAGNOSIS — Z8249 Family history of ischemic heart disease and other diseases of the circulatory system: Secondary | ICD-10-CM | POA: Insufficient documentation

## 2017-12-05 DIAGNOSIS — Z79899 Other long term (current) drug therapy: Secondary | ICD-10-CM | POA: Insufficient documentation

## 2017-12-05 DIAGNOSIS — Z7982 Long term (current) use of aspirin: Secondary | ICD-10-CM | POA: Insufficient documentation

## 2017-12-05 DIAGNOSIS — Z888 Allergy status to other drugs, medicaments and biological substances status: Secondary | ICD-10-CM | POA: Insufficient documentation

## 2017-12-05 MED ORDER — DICLOFENAC SODIUM 1 % TD GEL
2.0000 g | Freq: Four times a day (QID) | TRANSDERMAL | 1 refills | Status: AC
Start: 2017-12-05 — End: 2018-01-04

## 2017-12-05 MED ORDER — BUTALBITAL-ACETAMINOPHEN 50-300 MG PO TABS: ORAL_TABLET | ORAL | 0 refills | Status: DC

## 2017-12-05 NOTE — Progress Notes (Signed)
Assessment & Plan:  Connie Arroyo was seen today for arm pain.  Diagnoses and all orders for this visit:  Chronic tension-type headache, not intractable -     Butalbital-Acetaminophen 50-300 MG TABS; Take 1-2 tablets every 4-6 hours as needed for headaches. Do not take more than 6 tablets in a 24 hour period.  Left arm pain -     diclofenac sodium (VOLTAREN) 1 % GEL; Apply 2 g topically 4 (four) times daily.    Patient has been counseled on age-appropriate routine health concerns for screening and prevention. These are reviewed and up-to-date. Referrals have been placed accordingly. Immunizations are up-to-date or declined.    Subjective:   Chief Complaint  Patient presents with  . Arm Pain    Pt. stated she is having a left arm pain. Pt. stated there was a knot on Saturday and she put ice on it to make it go away.    HPI Connie Arroyo 57 y.o. female presents to office today with complaints of chronic headache and left arm pain.   She would like to be referred to another Neurologist. States "He didn't listen to anything I said".  Chronic Headaches Symptoms include dizziness, light and noise sensitivity. MRI of head negative. Reports an episode of transient loss of awareness with unknown etiology and neurology suggested EEG. She still has not had EEG performed. She may also need to see a cardiologist if EEG negative. Patient was urged to apply for the financial assistance program.  They were instructed to inquire at the front desk about the application process for the [employer] discount, orange card or other financial assistance.  She endorses increased stress at home with finances and family. She is currently taking namenda and endorses little relief of symptoms. Will try bupap. She needs to return to see neurology. Will place another referral once insurance issues have been resolved.   Left Arm Pain Onset several days ago. No known injuries. Pain is located in the upper left forearm  and described as shartp. Aggravating factors are palpation of the area.  The patient has applied ice to the area with completer relief of swelling.      Review of Systems  Constitutional: Negative for fever, malaise/fatigue and weight loss.  HENT: Negative.  Negative for nosebleeds.   Eyes: Negative.  Negative for blurred vision, double vision and photophobia.  Respiratory: Negative.  Negative for cough and shortness of breath.   Cardiovascular: Negative.  Negative for chest pain, palpitations and leg swelling.  Gastrointestinal: Negative.  Negative for heartburn, nausea and vomiting.  Musculoskeletal: Positive for joint pain and myalgias.       SEE HPI  Neurological: Positive for headaches. Negative for dizziness, focal weakness and seizures.  Psychiatric/Behavioral: Negative.  Negative for suicidal ideas.    Past Medical History:  Diagnosis Date  . Allergy   . Anxiety   . High cholesterol   . History of stomach ulcers   . Hypercholesteremia   . Hypertension     Past Surgical History:  Procedure Laterality Date  . ABDOMINAL HYSTERECTOMY  2002  . left shoulder surgery  2009  . TUBAL LIGATION  1986    Family History  Problem Relation Age of Onset  . Hypertension Mother   . Diabetes Father   . Hypertension Father   . Stroke Father   . Anuerysm Sister   . Stroke Sister   . Breast cancer Paternal Grandmother   . Breast cancer Paternal Aunt  Social History Reviewed with no changes to be made today.   Outpatient Medications Prior to Visit  Medication Sig Dispense Refill  . aspirin 81 MG tablet Take 81 mg by mouth daily.    Marland Kitchen eletriptan (RELPAX) 40 MG tablet Take 1 tablet (40 mg total) by mouth as needed for migraine or headache. May repeat in 2 hours if headache persists or recurs. 10 tablet 1  . escitalopram (LEXAPRO) 10 MG tablet Take 1 tablet (10 mg total) by mouth daily. 90 tablet 1  . IRON PO Take by mouth.    . memantine (NAMENDA) 10 MG tablet Take 10 mg by  mouth 2 (two) times daily.    . pantoprazole (PROTONIX) 20 MG tablet Take 1 tablet (20 mg total) by mouth daily. 90 tablet 1  . rosuvastatin (CRESTOR) 5 MG tablet Take 1 tablet (5 mg total) by mouth daily. 90 tablet 1   No facility-administered medications prior to visit.     Allergies  Allergen Reactions  . Benadryl [Diphenhydramine] Other (See Comments)    Agitation and hallucinations  . Codeine Nausea And Vomiting    No rash, swelling or other allergic sx  . Penicillins        Objective:    BP 129/89 (BP Location: Left Arm, Patient Position: Sitting, Cuff Size: Normal)   Pulse 63   Temp 98.8 F (37.1 C) (Oral)   Ht 5\' 4"  (1.626 m)   Wt 146 lb 9.6 oz (66.5 kg)   SpO2 93%   BMI 25.16 kg/m  Wt Readings from Last 3 Encounters:  12/05/17 146 lb 9.6 oz (66.5 kg)  11/01/17 151 lb 9.6 oz (68.8 kg)  03/22/17 148 lb (67.1 kg)    Physical Exam  Constitutional: She is oriented to person, place, and time. She appears well-developed and well-nourished. She is cooperative.  HENT:  Head: Normocephalic and atraumatic.  Eyes: EOM are normal.  Neck: Normal range of motion.  Cardiovascular: Normal rate, regular rhythm and normal heart sounds. Exam reveals no gallop and no friction rub.  No murmur heard. Pulmonary/Chest: Effort normal and breath sounds normal. No tachypnea. No respiratory distress. She has no decreased breath sounds. She has no wheezes. She has no rhonchi. She has no rales. She exhibits no tenderness.  Abdominal: Bowel sounds are normal.  Musculoskeletal: Normal range of motion. She exhibits no edema.       Left forearm: She exhibits tenderness (with minimal palpation of the area). She exhibits no bony tenderness, no swelling, no edema, no deformity and no laceration.  Neurological: She is alert and oriented to person, place, and time. Coordination normal.  Skin: Skin is warm and dry.  Psychiatric: She has a normal mood and affect. Her behavior is normal. Judgment and  thought content normal.  Nursing note and vitals reviewed.        Patient has been counseled extensively about nutrition and exercise as well as the importance of adherence with medications and regular follow-up. The patient was given clear instructions to go to ER or return to medical center if symptoms don't improve, worsen or new problems develop. The patient verbalized understanding.   Follow-up: Return in about 3 months (around 03/07/2018).   Claiborne Rigg, FNP-BC [facility] [place], [place] [phone]   12/08/2017, 11:20 AM

## 2017-12-08 ENCOUNTER — Encounter: Payer: Self-pay | Admitting: Nurse Practitioner

## 2017-12-29 ENCOUNTER — Ambulatory Visit: Payer: Self-pay | Attending: Family Medicine

## 2017-12-29 MED FILL — ELETRIPTAN HYDROBROMIDE 40: 40 | 30 days supply | Qty: 6 | Fill #1

## 2017-12-29 MED FILL — PANTOPRAZOLE SOD DR 20 MG T: 20 | 30 days supply | Qty: 30 | Fill #1

## 2017-12-29 MED FILL — DICLOFENAC SODIUM 1% GEL: 1 | 12 days supply | Qty: 100 | Fill #0

## 2018-01-03 ENCOUNTER — Telehealth: Payer: Self-pay

## 2018-01-03 NOTE — Telephone Encounter (Signed)
The pharmacy is requesting a switch to Fioricet due to cost of the Butalbital-Apap combo, please send new RX if that is acceptable, thanks!

## 2018-01-10 ENCOUNTER — Other Ambulatory Visit: Payer: Self-pay | Admitting: Nurse Practitioner

## 2018-01-10 MED ORDER — BUTALBITAL-APAP-CAFFEINE 50-325-40 MG PO TABS: 1.0000 | ORAL_TABLET | Freq: Four times a day (QID) | ORAL | 2 refills | Status: AC | PRN

## 2018-01-10 NOTE — Telephone Encounter (Signed)
Script has been sent.

## 2018-01-17 ENCOUNTER — Ambulatory Visit: Payer: Self-pay | Attending: Nurse Practitioner

## 2018-01-22 MED FILL — MEMANTINE HCL 10 MG TABLET: 10 | 30 days supply | Qty: 60 | Fill #1

## 2018-01-22 MED FILL — BUTALB-ACETAMIN-CAFF 50-325: 50-325-40 | 2 days supply | Qty: 20 | Fill #0

## 2018-01-22 MED FILL — ESCITALOPRAM 10 MG TABLET: 10 | 30 days supply | Qty: 30 | Fill #1

## 2018-02-06 ENCOUNTER — Ambulatory Visit
Admission: RE | Admit: 2018-02-06 | Discharge: 2018-02-06 | Disposition: A | Discharge status: Home / Self Care | Payer: Self-pay | Source: Ambulatory Visit | Attending: Obstetrics and Gynecology | Admitting: Obstetrics and Gynecology

## 2018-02-06 ENCOUNTER — Ambulatory Visit (HOSPITAL_COMMUNITY)
Admission: RE | Admit: 2018-02-06 | Discharge: 2018-02-06 | Disposition: A | Discharge status: Home / Self Care | Payer: Self-pay | Source: Ambulatory Visit | Attending: Obstetrics and Gynecology | Admitting: Obstetrics and Gynecology

## 2018-02-06 ENCOUNTER — Encounter (HOSPITAL_COMMUNITY): Payer: Self-pay

## 2018-02-06 VITALS — BP 132/84

## 2018-02-06 DIAGNOSIS — Z1239 Encounter for other screening for malignant neoplasm of breast: Secondary | ICD-10-CM

## 2018-02-06 DIAGNOSIS — Z1231 Encounter for screening mammogram for malignant neoplasm of breast: Secondary | ICD-10-CM

## 2018-02-06 NOTE — Progress Notes (Signed)
No complaints today.   Pap Smear: Pap smear not completed today. Last Pap smear was in 2002 per patient and normal. Per patient has no history of an abnormal Pap smear. Patient has a history of a complete hysterectomy 06/15/2000 for fibroids. Patient does not need any further Pap smears due to her history of a hysterectomy for benign reasons per BCCCP and ACOG guidelines. No Pap smear results are in EPIC.  Physical exam: Breasts Breasts symmetrical. No skin abnormalities bilateral breasts. No nipple retraction bilateral breasts. No nipple discharge bilateral breasts. No lymphadenopathy. No lumps palpated bilateral breasts. Patient has a lump center of chest below breasts that per patient has been there for years and assessed. No complaints of pain or tenderness on exam. [facility] mamm[facility]. Appointment scheduled for Tuesday, February 06, 2018 at 1530.        Pelvic/Bimanual No Pap smear completed today since patient has a history of a hysterectomy for benign reasons. Pap smear not indicated per [facility] guidelines.   Smoking History: Patient has never smoked.  Patient Navigation: Patient education provided. Access to services provided for patient through [facility] program.   Colorectal Cancer Screening: Per patient had a colonoscopy completed 9-10 years ago. No complaints today. FIT Test given to patient to complete and return to [facility]CP.  Breast and Cervical Cancer Risk Assessment: Patient has a family history of her paternal grandmother and a paternal aunt having breast cancer. Patient has no known genetic mutations or history of radiation treatment to the chest before age 57. Patient has no history of cervical dysplasia, immunocompromised, or DES exposure in-utero.  Risk Assessment    Risk Scores      02/06/2018   Last edited by: Lynnell DikeHolland, Sabrina H, LPN   5-year risk: 1.4 %   Lifetime risk: 7.3 %

## 2018-02-06 NOTE — Patient Instructions (Signed)
Explained breast self awareness with Silvestre MomentSheila A Waldo. Patient did not need a Pap smear today due to patient has a history of a hysterectomy for benign reasons. Let patient know that she doesn't need any further Pap smears due to her history of a hysterectomy for benign reasons. [facility] mamm[facility]. Appointment scheduled for Tuesday, February 06, 2018 at 1530. Patient aware of appointment and will be there. Let patient know the Breast Center will follow up with her within the next couple weeks with results of mammogram by letter or phone. Silvestre MomentSheila A Brook verbalized understanding.  Stephanos Fan, Kathaleen Maserhristine Poll, RN 2:25 PM

## 2018-02-07 ENCOUNTER — Encounter (HOSPITAL_COMMUNITY): Payer: Self-pay | Admitting: *Deleted

## 2018-02-14 ENCOUNTER — Other Ambulatory Visit: Payer: Self-pay | Admitting: Obstetrics and Gynecology

## 2018-02-22 LAB — FECAL OCCULT BLOOD, IMMUNOCHEMICAL: Fecal Occult Bld: NEGATIVE

## 2018-03-02 ENCOUNTER — Encounter (HOSPITAL_COMMUNITY): Payer: Self-pay

## 2018-03-09 ENCOUNTER — Encounter: Payer: Self-pay | Admitting: Nurse Practitioner

## 2018-03-09 ENCOUNTER — Ambulatory Visit: Payer: BLUE CROSS/BLUE SHIELD | Attending: Nurse Practitioner | Admitting: Nurse Practitioner

## 2018-03-09 VITALS — BP 124/77 | HR 66 | Temp 97.6°F | Ht 64.0 in | Wt 154.6 lb

## 2018-03-09 DIAGNOSIS — G44229 Chronic tension-type headache, not intractable: Secondary | ICD-10-CM

## 2018-03-09 DIAGNOSIS — K219 Gastro-esophageal reflux disease without esophagitis: Secondary | ICD-10-CM

## 2018-03-09 MED ORDER — PANTOPRAZOLE SODIUM 20 MG PO TBEC: 20.0000 mg | DELAYED_RELEASE_TABLET | Freq: Every day | ORAL | 1 refills | Status: DC

## 2018-03-09 NOTE — Patient Instructions (Signed)
Cervicogenic Headache  A cervicogenic headache is a headache caused by a condition that affects the bones and tissues in your neck (cervical spine). In a cervicogenic headache, the pain moves from your neck to your head. Most cervicogenic headaches start in the upper part of the neck with the first three cervical bones (cervical vertebrae). A cervicogenic headache is diagnosed when a cause can be found in the cervical spine and other causes of headaches can be ruled out. What are the causes? The most common cause of this condition is a traumatic injury to the cervical spine, such as whiplash. Other causes include:  Arthritis.  Broken bone (fracture).  Infection.  Tumor. What are the signs or symptoms? The most common symptoms are neck and head pain. The pain is often located on one side. In some cases, there may be head pain without neck pain. Pain may be felt in the neck, back or side of the head, face, or behind the eyes. Other symptoms include:  Limited movement in the neck.  Arm or shoulder pain. How is this diagnosed? This condition may be diagnosed based on:  Your symptoms.  A physical exam.  An injection that blocks nerve signals (nerve block).  Imaging tests, such as: ? X-rays. ? CT scan. ? MRI. How is this treated? Treatment for this condition may depend on the underlying condition. Treatment may include:  Medicines, such as: ? NSAIDs. ? Muscle relaxants.  Physical therapy.  Massage therapy.  Complementary therapies, such as: ? Biofeedback. ? Meditation. ? Acupuncture.  Nerve block injections.  Botulinum toxin injections. Your treatment plan may involve working with a pain management team that includes your primary health care provider, a pain management specialist, a neurologist, and a physical therapist. Follow these instructions at home:  Take over-the-counter and prescription medicines only as told by your health care provider.  Do exercises at  home as told by your physical therapist.  Return to your normal activities as told by your health care provider. Ask your health care provider what activities are safe for you. Avoid activities that trigger your headaches.  Maintain good neck support and posture at home and at work.  Keep all follow-up visits as told by your health care provider. This is important. Contact a health care provider if you have:  Headaches that are getting worse and happening more often.  Headaches with any of the following: ? Fever. ? Numbness. ? Weakness. ? Dizziness. ? Nausea or vomiting. Get help right away if:  You have a very sudden and severe headache. Summary  A cervicogenic headache is a headache caused by a condition that affects the bones and tissues in your cervical spine.  Your health care provider may diagnose this condition with a physical exam, a nerve block, and imaging tests.  Treatment may include medicine to reduce pain and inflammation, physical therapy, and nerve block injections.  Complementary therapies, such as acupuncture and meditation, may be added to other treatments.  Your treatment plan may involve working with a pain management team that includes your primary health care provider, a pain management specialist, a neurologist, and a physical therapist. This information is not intended to replace advice given to you by your health care provider. Make sure you discuss any questions you have with your health care provider. Document Released: 05/14/2003 Document Revised: 03/03/2017 Document Reviewed: 03/03/2017 Elsevier Interactive Patient Education  2019 [employer]vier Inc.  Migraine Headache A migraine headache is an intense, throbbing pain on one side or both  sides of the head. Migraines may also cause other symptoms, such as nausea, vomiting, and sensitivity to light and noise. What are the causes? Doing or taking certain things may also trigger migraines, such  as:  Alcohol.  Smoking.  Medicines, such as: ? Medicine used to treat chest pain (nitroglycerine). ? Birth control pills. ? Estrogen pills. ? Certain blood pressure medicines.  Aged cheeses, chocolate, or caffeine.  Foods or drinks that contain nitrates, glutamate, aspartame, or tyramine.  Physical activity. Other things that may trigger a migraine include:  Menstruation.  Pregnancy.  Hunger.  Stress, lack of sleep, too much sleep, or fatigue.  Weather changes. What increases the risk? The following factors may make you more likely to experience migraine headaches:  Age. Risk increases with age.  Family history of migraine headaches.  Being Caucasian.  Depression and anxiety.  Obesity.  Being a woman.  Having a hole in the heart (patent foramen ovale) or other heart problems. What are the signs or symptoms? The main symptom of this condition is pulsating or throbbing pain. Pain may:  Happen in any area of the head, such as on one side or both sides.  Interfere with daily activities.  Get worse with physical activity.  Get worse with exposure to bright lights or loud noises. Other symptoms may include:  Nausea.  Vomiting.  Dizziness.  General sensitivity to bright lights, loud noises, or smells. Before you get a migraine, you may get warning signs that a migraine is developing (aura). An aura may include:  Seeing flashing lights or having blind spots.  Seeing bright spots, halos, or zigzag lines.  Having tunnel vision or blurred vision.  Having numbness or a tingling feeling.  Having trouble talking.  Having muscle weakness. How is this diagnosed? A migraine headache can be diagnosed based on:  Your symptoms.  A physical exam.  Tests, such as CT scan or MRI of the head. These imaging tests can help rule out other causes of headaches.  Taking fluid from the spine (lumbar puncture) and analyzing it (cerebrospinal fluid analysis, or CSF  analysis). How is this treated? A migraine headache is usually treated with medicines that:  Relieve pain.  Relieve nausea.  Prevent migraines from coming back. Treatment may also include:  Acupuncture.  Lifestyle changes like avoiding foods that trigger migraines. Follow these instructions at home: Medicines  Take over-the-counter and prescription medicines only as told by your health care provider.  Do not drive or use heavy machinery while taking prescription pain medicine.  To prevent or treat constipation while you are taking prescription pain medicine, your health care provider may recommend that you: ? Drink enough fluid to keep your urine clear or pale yellow. ? Take over-the-counter or prescription medicines. ? Eat foods that are high in fiber, such as fresh fruits and vegetables, whole grains, and beans. ? Limit foods that are high in fat and processed sugars, such as fried and sweet foods. Lifestyle  Avoid alcohol use.  Do not use any products that contain nicotine or tobacco, such as cigarettes and e-cigarettes. If you need help quitting, ask your health care provider.  Get at least 8 hours of sleep every night.  Limit your stress. General instructions      Keep a journal to find out what may trigger your migraine headaches. For example, write down: ? What you eat and drink. ? How much sleep you get. ? Any change to your diet or medicines.  If you have a  migraine: ? Avoid things that make your symptoms worse, such as bright lights. ? It may help to lie down in a dark, quiet room. ? Do not drive or use heavy machinery. ? Ask your health care provider what activities are safe for you while you are experiencing symptoms.  Keep all follow-up visits as told by your health care provider. This is important. Contact a health care provider if:  You develop symptoms that are different or more severe than your usual migraine symptoms. Get help right away  if:  Your migraine becomes severe.  You have a fever.  You have a stiff neck.  You have vision loss.  Your muscles feel weak or like you cannot control them.  You start to lose your balance often.  You develop trouble walking.  You faint. This information is not intended to replace advice given to you by your health care provider. Make sure you discuss any questions you have with your health care provider. Document Released: 02/21/2005 Document Revised: 09/11/2015 Document Reviewed: 08/10/2015 Elsevier Interactive Patient Education  2019 [employer]

## 2018-03-09 NOTE — Progress Notes (Signed)
Assessment & Plan:  Connie Arroyo was seen today for headache.  Diagnoses and all orders for this visit:  Chronic tension-type headache, not intractable Continue butalbital as prescribed.  Avoid caffeine, fatty foods, MSG and alcohol  Gastroesophageal reflux disease, esophagitis presence not specified -     pantoprazole (PROTONIX) 20 MG tablet; Take 1 tablet (20 mg total) by mouth daily. INSTRUCTIONS: Avoid GERD Triggers: acidic, spicy or fried foods, caffeine, coffee, sodas,  alcohol and chocolate.     Patient has been counseled on age-appropriate routine health concerns for screening and prevention. These are reviewed and up-to-date. Referrals have been placed accordingly. Immunizations are up-to-date or declined.    Subjective:   Chief Complaint  Patient presents with  . Headache   HPI Connie Arroyo 58 y.o. female presents to office today for follow up to chronic headaches. She is currently taking butalbital for her headaches which she reports provides significant relief at this time. She has a disc with her from [facility] which she is requesting that I download and interpret the data for her. Apparently she is trying to get disability and there was imaging performed of her spine through the disability agency. They did not interpret the results for her and she has the CD with her today. I have instructed her that she will need to speak with her disability lawyer regarding the results of her imaging. She verbalized understanding.    GERD: Patient complains of heartburn. This has been associated with heartburn.  She denies bilious reflux, chest pain, choking on food, dysphagia, regurgitation of undigested food and upper abdominal discomfort. Symptoms have been present for several years. She denies dysphagia.  She has not lost weight. She denies melena, hematochezia, hematemesis, and coffee ground emesis. Medical therapy in the past has included proton pump inhibitors which provide  significant relief of her symptoms.   Review of Systems  Constitutional: Negative for fever, malaise/fatigue and weight loss.  HENT: Negative.  Negative for nosebleeds.   Eyes: Negative.  Negative for blurred vision, double vision and photophobia.  Respiratory: Negative.  Negative for cough and shortness of breath.   Cardiovascular: Negative.  Negative for chest pain, palpitations and leg swelling.  Gastrointestinal: Positive for heartburn. Negative for nausea and vomiting.  Musculoskeletal: Positive for back pain. Negative for myalgias.  Neurological: Positive for headaches. Negative for dizziness, focal weakness and seizures.  Psychiatric/Behavioral: Positive for memory loss. Negative for suicidal ideas.    Past Medical History:  Diagnosis Date  . Allergy   . Anxiety   . High cholesterol   . History of stomach ulcers   . Hypercholesteremia   . Hypertension     Past Surgical History:  Procedure Laterality Date  . ABDOMINAL HYSTERECTOMY  2002  . left shoulder surgery  2009  . TUBAL LIGATION  1986    Family History  Problem Relation Age of Onset  . Hypertension Mother   . Diabetes Father   . Hypertension Father   . Stroke Father   . Anuerysm Sister   . Stroke Sister   . Breast cancer Paternal Grandmother   . Breast cancer Paternal Aunt     Social History Reviewed with no changes to be made today.   Outpatient Medications Prior to Visit  Medication Sig Dispense Refill  . aspirin 81 MG tablet Take 81 mg by mouth daily.    . butalbital-acetaminophen-caffeine (FIORICET, ESGIC) 50-325-40 MG tablet Take 1-2 tablets by mouth every 6 (six) hours as needed for headache. 20  tablet 2  . eletriptan (RELPAX) 40 MG tablet Take 1 tablet (40 mg total) by mouth as needed for migraine or headache. May repeat in 2 hours if headache persists or recurs. 10 tablet 1  . escitalopram (LEXAPRO) 10 MG tablet Take 1 tablet (10 mg total) by mouth daily. 90 tablet 1  . IRON PO Take by mouth.      . memantine (NAMENDA) 10 MG tablet Take 10 mg by mouth 2 (two) times daily.    . rosuvastatin (CRESTOR) 5 MG tablet Take 1 tablet (5 mg total) by mouth daily. 90 tablet 1  . pantoprazole (PROTONIX) 20 MG tablet Take 1 tablet (20 mg total) by mouth daily. 90 tablet 1  . Butalbital-Acetaminophen 50-300 MG TABS Take 1-2 tablets every 4-6 hours as needed for headaches. Do not take more than 6 tablets in a 24 hour period. (Patient not taking: Reported on 02/06/2018) 84 tablet 0   No facility-administered medications prior to visit.     Allergies  Allergen Reactions  . Benadryl [Diphenhydramine] Other (See Comments)    Agitation and hallucinations  . Codeine Nausea And Vomiting    No rash, swelling or other allergic sx  . Penicillins        Objective:    BP 124/77   Pulse 66   Temp 97.6 F (36.4 C) (Oral)   Ht 5\' 4"  (1.626 m)   Wt 154 lb 9.6 oz (70.1 kg)   SpO2 99%   BMI 26.54 kg/m  Wt Readings from Last 3 Encounters:  03/09/18 154 lb 9.6 oz (70.1 kg)  12/05/17 146 lb 9.6 oz (66.5 kg)  11/01/17 151 lb 9.6 oz (68.8 kg)    Physical Exam Vitals signs and nursing note reviewed.  Constitutional:      Appearance: She is well-developed.  HENT:     Head: Normocephalic and atraumatic.  Neck:     Musculoskeletal: Normal range of motion.  Cardiovascular:     Rate and Rhythm: Normal rate and regular rhythm.     Heart sounds: Normal heart sounds. No murmur. No friction rub. No gallop.   Pulmonary:     Effort: Pulmonary effort is normal. No tachypnea or respiratory distress.     Breath sounds: Normal breath sounds. No decreased breath sounds, wheezing, rhonchi or rales.  Chest:     Chest wall: No tenderness.  Abdominal:     General: Bowel sounds are normal.     Palpations: Abdomen is soft.  Musculoskeletal: Normal range of motion.  Skin:    General: Skin is warm and dry.  Neurological:     Mental Status: She is alert and oriented to person, place, and time.     Coordination:  Coordination normal.  Psychiatric:        Behavior: Behavior normal. Behavior is cooperative.        Thought Content: Thought content normal.        Judgment: Judgment normal.          Patient has been counseled extensively about nutrition and exercise as well as the importance of adherence with medications and regular follow-up. The patient was given clear instructions to go to ER or return to medical center if symptoms don't improve, worsen or new problems develop. The patient verbalized understanding.   Follow-up: Return in about 3 months (around 06/08/2018), or if symptoms worsen or fail to improve, for Headaches/Physical.   Claiborne RiggZelda W Amiria Orrison, FNP-BC [facility] [place], [place] [phone]  03/10/2018, 10:53 AM

## 2018-03-10 ENCOUNTER — Encounter: Payer: Self-pay | Admitting: Nurse Practitioner

## 2018-03-19 MED FILL — BUTALB-ACETAMIN-CAFF 50-325: 50-325-40 | 2 days supply | Qty: 20 | Fill #1

## 2018-03-19 MED FILL — ESCITALOPRAM 10 MG TABLET: 10 | 30 days supply | Qty: 30 | Fill #2

## 2018-03-19 MED FILL — PANTOPRAZOLE SOD DR 20 MG T: 20 | 30 days supply | Qty: 30 | Fill #2

## 2018-05-15 MED FILL — PANTOPRAZOLE SOD DR 20 MG T: 20 | 30 days supply | Qty: 30 | Fill #3

## 2018-05-15 MED FILL — MEMANTINE HCL 10 MG TABLET: 10 | 30 days supply | Qty: 60 | Fill #2

## 2018-05-15 MED FILL — ESCITALOPRAM 10 MG TABLET: 10 | 30 days supply | Qty: 30 | Fill #3

## 2018-05-15 MED FILL — ROSUVASTATIN CALCIUM 5 MG T: 5 | 30 days supply | Qty: 30 | Fill #0

## 2018-05-31 MED FILL — MEMANTINE HCL 10 MG TABLET: 10 | 30 days supply | Qty: 60 | Fill #3

## 2018-05-31 MED FILL — BUTALB-ACETAMIN-CAFF 50-325: 50-325-40 | 2 days supply | Qty: 20 | Fill #2

## 2018-05-31 MED FILL — ESCITALOPRAM 10 MG TABLET: 10 | 30 days supply | Qty: 30 | Fill #4

## 2018-05-31 MED FILL — PANTOPRAZOLE SOD DR 20 MG T: 20 | 30 days supply | Qty: 30 | Fill #4

## 2018-05-31 MED FILL — ROSUVASTATIN CALCIUM 5 MG T: 5 | 30 days supply | Qty: 30 | Fill #1

## 2018-05-31 MED FILL — ELETRIPTAN HYDROBROMIDE 40: 40 | 30 days supply | Qty: 6 | Fill #2

## 2018-06-08 ENCOUNTER — Ambulatory Visit: Payer: Self-pay | Admitting: Nurse Practitioner

## 2018-06-20 ENCOUNTER — Ambulatory Visit: Payer: BLUE CROSS/BLUE SHIELD | Attending: Nurse Practitioner | Admitting: Nurse Practitioner

## 2018-06-20 ENCOUNTER — Other Ambulatory Visit: Payer: Self-pay

## 2018-06-20 ENCOUNTER — Encounter: Payer: Self-pay | Admitting: Nurse Practitioner

## 2018-06-20 DIAGNOSIS — R430 Anosmia: Secondary | ICD-10-CM | POA: Diagnosis not present

## 2018-06-20 DIAGNOSIS — G43909 Migraine, unspecified, not intractable, without status migrainosus: Secondary | ICD-10-CM | POA: Diagnosis not present

## 2018-06-20 NOTE — Progress Notes (Signed)
Assessment & Plan:  Connie Arroyo was seen today for follow-up.  Diagnoses and all orders for this visit:  Migraine without status migrainosus, not intractable, unspecified migraine type -     Ambulatory referral to Neurology  Anosmia -     Ambulatory referral to ENT    Patient has been counseled on age-appropriate routine health concerns for screening and prevention. These are reviewed and up-to-date. Referrals have been placed accordingly. Immunizations are up-to-date or declined.    Subjective:   Chief Complaint  Patient presents with  . Follow-up    Pt. stated her headache is getting worst.    HPI Connie Arroyo 58 y.o. female presents for tele health visit.  She has a history of migraines, hypertension and anxiety.   Migraines Currently [facility].  Unfortunately not happy with the suggested treatments that Dr. Maple HudsonMoser has recommended and would like a second opinion at this time.  States that she feels like a "[place]". Did not want to try botox or any other injections. I have deferred her to speak to DR. Moser regarding other options to help relieve her migraines. She also endorses issues with decreased sense of smell. At first she stated the anosmia was occurring with her migraines however after I explained that the anosmia could be related to auras she then tells me that the anosmia occurs outside of her migraines as well. Will refer to ENT per patient request.    Review of Systems  Constitutional: Negative for fever, malaise/fatigue and weight loss.  HENT: Negative for nosebleeds.        SEE HPI  Eyes: Negative.  Negative for blurred vision, double vision and photophobia.  Respiratory: Negative.  Negative for cough and shortness of breath.   Cardiovascular: Negative.  Negative for chest pain, palpitations and leg swelling.  Gastrointestinal: Negative.  Negative for heartburn, nausea and vomiting.   Musculoskeletal: Negative.  Negative for myalgias.  Neurological: Positive for headaches. Negative for dizziness, focal weakness and seizures.  Endo/Heme/Allergies: Positive for environmental allergies.  Psychiatric/Behavioral: Negative for suicidal ideas. The patient is nervous/anxious.     Past Medical History:  Diagnosis Date  . Allergy   . Anxiety   . High cholesterol   . History of stomach ulcers   . Hypercholesteremia   . Hypertension     Past Surgical History:  Procedure Laterality Date  . ABDOMINAL HYSTERECTOMY  2002  . left shoulder surgery  2009  . TUBAL LIGATION  1986    Family History  Problem Relation Age of Onset  . Hypertension Mother   . Diabetes Father   . Hypertension Father   . Stroke Father   . Anuerysm Sister   . Stroke Sister   . Breast cancer Paternal Grandmother   . Breast cancer Paternal Aunt     Social History Reviewed with no changes to be made today.   Outpatient Medications Prior to Visit  Medication Sig Dispense Refill  . aspirin 81 MG tablet Take 81 mg by mouth daily.    . butalbital-acetaminophen-caffeine (FIORICET, ESGIC) 50-325-40 MG tablet Take 1-2 tablets by mouth every 6 (six) hours as needed for headache. 20 tablet 2  . eletriptan (RELPAX) 40 MG tablet Take 1 tablet (40 mg total) by mouth as needed for migraine or headache. May repeat in 2 hours if headache persists or recurs. 10 tablet 1  . escitalopram (LEXAPRO) 10 MG tablet Take 1 tablet (10 mg total)  by mouth daily. 90 tablet 1  . IRON PO Take by mouth.    . memantine (NAMENDA) 10 MG tablet Take 10 mg by mouth 2 (two) times daily.    . pantoprazole (PROTONIX) 20 MG tablet Take 1 tablet (20 mg total) by mouth daily. 90 tablet 1  . rosuvastatin (CRESTOR) 5 MG tablet Take 1 tablet (5 mg total) by mouth daily. 90 tablet 1   No facility-administered medications prior to visit.     Allergies  Allergen Reactions  . Benadryl [Diphenhydramine] Other (See Comments)    Agitation  and hallucinations  . Codeine Nausea And Vomiting    No rash, swelling or other allergic sx  . Penicillins        Objective:    There were no vitals taken for this visit. Wt Readings from Last 3 Encounters:  03/09/18 154 lb 9.6 oz (70.1 kg)  12/05/17 146 lb 9.6 oz (66.5 kg)  11/01/17 151 lb 9.6 oz (68.8 kg)         Patient has been counseled extensively about nutrition and exercise as well as the importance of adherence with medications and regular follow-up. The patient was given clear instructions to go to ER or return to medical center if symptoms don't improve, worsen or new problems develop. The patient verbalized understanding.   Follow-up: Return in about 3 months (around 09/19/2018).   Total telehealth phone time spent with patient was 20 min. Greater than 50 % of this visit was spent counseling and coordinating care regarding risk factor modification, compliance importance and encouragement, education related to the chronic medical  conditions.  Claiborne Rigg, FNP-BC [facility] [place], [place] [phone]   06/20/2018, 5:55 PM

## 2018-07-09 DIAGNOSIS — R404 Transient alteration of awareness: Secondary | ICD-10-CM

## 2018-07-09 HISTORY — DX: Transient alteration of awareness: R40.4

## 2018-07-09 MED FILL — NORTRIPTYLINE HCL 10 MG CAP: 10 | 30 days supply | Qty: 90 | Fill #0

## 2018-08-27 MED FILL — CYCLOBENZAPRINE HCL 10 MG T: 10 | 10 days supply | Qty: 30 | Fill #0

## 2018-09-05 MED FILL — PANTOPRAZOLE SOD DR 20 MG T: 20 | 30 days supply | Qty: 30 | Fill #0

## 2018-09-05 MED FILL — ELETRIPTAN HYDROBROMIDE 40: 40 | 10 days supply | Qty: 2 | Fill #3

## 2018-09-06 MED FILL — ESCITALOPRAM 10 MG TABLET: 10 | 30 days supply | Qty: 30 | Fill #0

## 2018-10-01 ENCOUNTER — Other Ambulatory Visit: Payer: Self-pay | Admitting: Nurse Practitioner

## 2018-10-01 DIAGNOSIS — F419 Anxiety disorder, unspecified: Secondary | ICD-10-CM

## 2018-10-01 MED FILL — ESCITALOPRAM 10 MG TABLET: 10 | 30 days supply | Qty: 30 | Fill #0

## 2018-10-01 MED FILL — ROSUVASTATIN CALCIUM 5 MG T: 5 | 30 days supply | Qty: 30 | Fill #0

## 2018-10-01 MED FILL — NORTRIPTYLINE HCL 10 MG CAP: 10 | 30 days supply | Qty: 90 | Fill #0

## 2018-10-29 MED FILL — PANTOPRAZOLE SOD DR 20 MG T: 20 | 30 days supply | Qty: 30 | Fill #1

## 2018-11-29 ENCOUNTER — Ambulatory Visit: Payer: BLUE CROSS/BLUE SHIELD | Admitting: Pharmacist

## 2018-12-04 ENCOUNTER — Other Ambulatory Visit: Payer: Self-pay | Admitting: Nurse Practitioner

## 2018-12-04 DIAGNOSIS — G44229 Chronic tension-type headache, not intractable: Secondary | ICD-10-CM

## 2018-12-06 MED FILL — ROSUVASTATIN CALCIUM 5 MG T: 5 | 30 days supply | Qty: 30 | Fill #0

## 2018-12-06 MED FILL — ELETRIPTAN HYDROBROMIDE 40: 40 | 30 days supply | Qty: 6 | Fill #0

## 2018-12-12 ENCOUNTER — Ambulatory Visit: Payer: BLUE CROSS/BLUE SHIELD | Admitting: Pharmacist

## 2018-12-14 ENCOUNTER — Ambulatory Visit: Payer: BLUE CROSS/BLUE SHIELD | Admitting: Pharmacist

## 2019-02-04 MED FILL — PANTOPRAZOLE SOD DR 20 MG T: 20 | 30 days supply | Qty: 30 | Fill #3

## 2019-02-04 MED FILL — ESCITALOPRAM 10 MG TABLET: 10 | 30 days supply | Qty: 30 | Fill #2

## 2019-02-11 ENCOUNTER — Other Ambulatory Visit: Payer: Self-pay | Admitting: Nurse Practitioner

## 2019-04-02 ENCOUNTER — Telehealth: Payer: Self-pay | Admitting: Nurse Practitioner

## 2019-04-02 NOTE — Telephone Encounter (Signed)
Patient was informed that having migraines and arthritis listed as a disability was the reason her paperwork was denied. Patient verbalized understanding and had no further questions.

## 2019-04-02 NOTE — Telephone Encounter (Signed)
Patient called and wanted to know why her disability handicap request paperwork was denied. Please fu at your earliest convenience.

## 2019-04-10 ENCOUNTER — Other Ambulatory Visit: Payer: Self-pay

## 2019-04-10 ENCOUNTER — Ambulatory Visit: Payer: Medicaid Other | Attending: Nurse Practitioner | Admitting: Nurse Practitioner

## 2019-04-10 ENCOUNTER — Encounter: Payer: Self-pay | Admitting: Nurse Practitioner

## 2019-04-10 DIAGNOSIS — K219 Gastro-esophageal reflux disease without esophagitis: Secondary | ICD-10-CM | POA: Insufficient documentation

## 2019-04-10 DIAGNOSIS — F419 Anxiety disorder, unspecified: Secondary | ICD-10-CM | POA: Insufficient documentation

## 2019-04-10 DIAGNOSIS — I10 Essential (primary) hypertension: Secondary | ICD-10-CM | POA: Diagnosis not present

## 2019-04-10 DIAGNOSIS — F329 Major depressive disorder, single episode, unspecified: Secondary | ICD-10-CM | POA: Insufficient documentation

## 2019-04-10 DIAGNOSIS — Z8249 Family history of ischemic heart disease and other diseases of the circulatory system: Secondary | ICD-10-CM | POA: Insufficient documentation

## 2019-04-10 DIAGNOSIS — G44229 Chronic tension-type headache, not intractable: Secondary | ICD-10-CM

## 2019-04-10 DIAGNOSIS — Z79899 Other long term (current) drug therapy: Secondary | ICD-10-CM | POA: Insufficient documentation

## 2019-04-10 DIAGNOSIS — E7800 Pure hypercholesterolemia, unspecified: Secondary | ICD-10-CM | POA: Diagnosis not present

## 2019-04-10 MED ORDER — ESCITALOPRAM OXALATE 10 MG PO TABS: 10.0000 mg | ORAL_TABLET | Freq: Every day | ORAL | 1 refills | Status: DC

## 2019-04-10 MED ORDER — PANTOPRAZOLE SODIUM 20 MG PO TBEC: 20.0000 mg | DELAYED_RELEASE_TABLET | Freq: Every day | ORAL | 1 refills | Status: DC

## 2019-04-10 MED ORDER — ROSUVASTATIN CALCIUM 5 MG PO TABS: 5.0000 mg | ORAL_TABLET | Freq: Every day | ORAL | 1 refills | Status: DC

## 2019-04-10 MED ORDER — ELETRIPTAN HYDROBROMIDE 40 MG PO TABS: 40.0000 mg | ORAL_TABLET | ORAL | 6 refills | Status: DC | PRN

## 2019-04-10 MED FILL — ESCITALOPRAM 10 MG TABLET: 10 | 30 days supply | Qty: 30 | Fill #0

## 2019-04-10 MED FILL — ROSUVASTATIN CALCIUM 5 MG T: 5 | 30 days supply | Qty: 30 | Fill #0

## 2019-04-10 MED FILL — ELETRIPTAN HYDROBROMIDE 40: 40 | 30 days supply | Qty: 12 | Fill #0

## 2019-04-10 MED FILL — PANTOPRAZOLE SOD DR 20 MG T: 20 | 30 days supply | Qty: 30 | Fill #0

## 2019-04-10 NOTE — Progress Notes (Signed)
Virtual Visit via Telephone Note Due to national recommendations of social distancing due to COVID 19, telehealth visit is felt to be most appropriate for this patient at this time.  I discussed the limitations, risks, security and privacy concerns of performing an evaluation and management service by telephone and the availability of in person appointments. I also discussed with the patient that there may be a patient responsible charge related to this service. The patient expressed understanding and agreed to proceed.    I connected with Connie Arroyo on 04/13/19  at  11:10 AM EST  EDT by telephone and verified that I am speaking with the correct person using two identifiers.   Consent I discussed the limitations, risks, security and privacy concerns of performing an evaluation and management service by telephone and the availability of in person appointments. I also discussed with the patient that there may be a patient responsible charge related to this service. The patient expressed understanding and agreed to proceed.   Location of Patient: Private Residence    [facility] of Provider: [facility]    Persons participating in Telemedicine visit: Connie Denver FNP-BC YY [place] CMA Connie Arroyo    History of Present Illness: Telemedicine visit for: Follow Up  Depression and Anxiety Ran out of her lexapro months ago. Endorses decreased appetite and depression as she lost her close friend a few months ago. Would like to have her lexapro refilled. I recommended grief support groups however she declined.   GERD Symptoms well controlled with pantoprazole 20 mg daily.  She denies chest pain, deep pressure at base of neck, difficulty swallowing, fullness after meals, hematemesis, melena, nausea, nocturnal burning, regurgitation of undigested food, symptoms primarily relate to meals, and lying down after meals and upper abdominal discomfort.   Past Medical  History:  Diagnosis Date  . Allergy   . Anxiety   . High cholesterol   . History of stomach ulcers   . Hypercholesteremia   . Hypertension     Past Surgical History:  Procedure Laterality Date  . ABDOMINAL HYSTERECTOMY  2002  . left shoulder surgery  2009  . TUBAL LIGATION  1986    Family History  Problem Relation Age of Onset  . Hypertension Mother   . Diabetes Father   . Hypertension Father   . Stroke Father   . Anuerysm Sister   . Stroke Sister   . Breast cancer Paternal Grandmother   . Breast cancer Paternal Aunt     Social History   Socioeconomic History  . Marital status: Single    Spouse name: Not on file  . Number of children: Not on file  . Years of education: Not on file  . Highest education level: Not on file  Occupational History  . Not on file  Tobacco Use  . Smoking status: Never Smoker  . Smokeless tobacco: Never Used  Substance and Sexual Activity  . Alcohol use: No  . Drug use: No  . Sexual activity: Yes    Birth control/protection: Surgical  Other Topics Concern  . Not on file  Social History Narrative  . Not on file   Social Determinants of Health   Financial Resource Strain:   . Difficulty of Paying Living Expenses: Not on file  Food Insecurity:   . Worried About Programme researcher, broadcasting/film/video in the Last Year: Not on file  . Ran Out of Food in the Last Year: Not on file  Transportation Needs:   .  Lack of Transportation (Medical): Not on file  . Lack of Transportation (Non-Medical): Not on file  Physical Activity:   . Days of Exercise per Week: Not on file  . Minutes of Exercise per Session: Not on file  Stress:   . Feeling of Stress : Not on file  Social Connections:   . Frequency of Communication with Friends and Family: Not on file  . Frequency of Social Gatherings with Friends and Family: Not on file  . Attends Religious Services: Not on file  . Active Member of Clubs or Organizations: Not on file  . Attends Archivist  Meetings: Not on file  . Marital Status: Not on file     Observations/Objective: Awake, alert and oriented x 3   Review of Systems  Constitutional: Negative for fever, malaise/fatigue and weight loss.  HENT: Negative.  Negative for nosebleeds.   Eyes: Negative.  Negative for blurred vision, double vision and photophobia.  Respiratory: Negative.  Negative for cough and shortness of breath.   Cardiovascular: Negative.  Negative for chest pain, palpitations and leg swelling.  Gastrointestinal: Negative.  Negative for heartburn, nausea and vomiting.  Genitourinary: Negative.   Musculoskeletal: Negative.  Negative for myalgias.  Neurological: Positive for headaches. Negative for dizziness, focal weakness and seizures.  Psychiatric/Behavioral: Positive for depression. Negative for suicidal ideas. The patient has insomnia.     Assessment and Plan: [name] was seen today for follow-up.  Diagnoses and all orders for this visit:  Anxiety -     escitalopram (LEXAPRO) 10 MG tablet; Take 1 tablet (10 mg total) by mouth daily.  GERD without esophagitis -     pantoprazole (PROTONIX) 20 MG tablet; Take 1 tablet (20 mg total) by mouth daily. INSTRUCTIONS: Avoid GERD Triggers: acidic, spicy or fried foods, caffeine, coffee, sodas,  alcohol and chocolate.   Chronic tension-type headache, not intractable -     eletriptan (RELPAX) 40 MG tablet; Take 1 tablet (40 mg total) by mouth as needed for migraine or headache. May repeat in 2 hours if headache persists or recurs. Avoid caffeine   Dyslipidemia -     rosuvastatin (CRESTOR) 5 MG tablet; Take 1 tablet (5 mg total) by mouth daily. Lab Results  Component Value Date   LDLCALC 108 (H) 11/01/2017  INSTRUCTIONS: Work on a low fat, heart healthy diet and participate in regular aerobic exercise program by working out at least 150 minutes per week; 5 days a week-30 minutes per day. Avoid red meat/beef/steak,  fried foods. junk foods, sodas, sugary  drinks, unhealthy snacking, alcohol and smoking.  Drink at least 80 oz of water per day and monitor your carbohydrate intake daily.      Follow Up Instructions Return in about 3 months (around 07/08/2019).     I discussed the assessment and treatment plan with the patient. The patient was provided an opportunity to ask questions and all were answered. The patient agreed with the plan and demonstrated an understanding of the instructions.   The patient was advised to call back or seek an in-person evaluation if the symptoms worsen or if the condition fails to improve as anticipated.  I provided 16 minutes of non-face-to-face time during this encounter including median intraservice time, reviewing previous notes, labs, imaging, medications and explaining diagnosis and management.  Gildardo Pounds, FNP-BC

## 2019-04-13 ENCOUNTER — Encounter: Payer: Self-pay | Admitting: Nurse Practitioner

## 2019-04-15 ENCOUNTER — Other Ambulatory Visit: Payer: Self-pay

## 2019-04-17 ENCOUNTER — Ambulatory Visit: Payer: Medicaid Other | Attending: Nurse Practitioner

## 2019-04-17 ENCOUNTER — Other Ambulatory Visit: Payer: Self-pay

## 2019-04-17 DIAGNOSIS — R7303 Prediabetes: Secondary | ICD-10-CM

## 2019-04-17 DIAGNOSIS — Z0000 Encounter for general adult medical examination without abnormal findings: Secondary | ICD-10-CM

## 2019-04-18 LAB — CMP14+EGFR
ALT: 17 IU/L (ref 0–32)
AST: 19 IU/L (ref 0–40)
Albumin/Globulin Ratio: 1.3 (ref 1.2–2.2)
Albumin: 4.4 g/dL (ref 3.8–4.9)
Alkaline Phosphatase: 121 IU/L — ABNORMAL HIGH (ref 39–117)
BUN/Creatinine Ratio: 12 (ref 9–23)
BUN: 10 mg/dL (ref 6–24)
Bilirubin Total: 0.3 mg/dL (ref 0.0–1.2)
CO2: 24 mmol/L (ref 20–29)
Calcium: 10.5 mg/dL — ABNORMAL HIGH (ref 8.7–10.2)
Chloride: 98 mmol/L (ref 96–106)
Creatinine, Ser: 0.85 mg/dL (ref 0.57–1.00)
GFR calc Af Amer: 87 mL/min/{1.73_m2} (ref >59)
GFR calc non Af Amer: 75 mL/min/{1.73_m2} (ref >59)
Globulin, Total: 3.3 g/dL (ref 1.5–4.5)
Glucose: 91 mg/dL (ref 65–99)
Potassium: 4.2 mmol/L (ref 3.5–5.2)
Sodium: 139 mmol/L (ref 134–144)
Total Protein: 7.7 g/dL (ref 6.0–8.5)

## 2019-04-18 LAB — LIPID PANEL
Chol/HDL Ratio: 2.9 ratio (ref 0.0–4.4)
Cholesterol, Total: 265 mg/dL — ABNORMAL HIGH (ref 100–199)
HDL: 92 mg/dL (ref >39)
LDL Chol Calc (NIH): 159 mg/dL — ABNORMAL HIGH (ref 0–99)
Triglycerides: 86 mg/dL (ref 0–149)
VLDL Cholesterol Cal: 14 mg/dL (ref 5–40)

## 2019-04-18 LAB — CBC
Hematocrit: 41.4 % (ref 34.0–46.6)
Hemoglobin: 13.2 g/dL (ref 11.1–15.9)
MCH: 25.7 pg — ABNORMAL LOW (ref 26.6–33.0)
MCHC: 31.9 g/dL (ref 31.5–35.7)
MCV: 81 fL (ref 79–97)
Platelets: 298 10*3/uL (ref 150–450)
RBC: 5.13 x10E6/uL (ref 3.77–5.28)
RDW: 14.1 % (ref 11.7–15.4)
WBC: 7.1 10*3/uL (ref 3.4–10.8)

## 2019-04-18 LAB — HEMOGLOBIN A1C
Est. average glucose Bld gHb Est-mCnc: 123 mg/dL
Hgb A1c MFr Bld: 5.9 % — ABNORMAL HIGH (ref 4.8–5.6)

## 2019-04-18 LAB — VITAMIN D 25 HYDROXY (VIT D DEFICIENCY, FRACTURES): Vit D, 25-Hydroxy: 19.5 ng/mL — ABNORMAL LOW (ref 30.0–100.0)

## 2019-04-19 ENCOUNTER — Other Ambulatory Visit: Payer: Self-pay | Admitting: Nurse Practitioner

## 2019-04-19 DIAGNOSIS — Z1231 Encounter for screening mammogram for malignant neoplasm of breast: Secondary | ICD-10-CM

## 2019-04-21 ENCOUNTER — Other Ambulatory Visit: Payer: Self-pay | Admitting: Nurse Practitioner

## 2019-04-21 MED ORDER — VITAMIN D (ERGOCALCIFEROL) 1.25 MG (50000 UNIT) PO CAPS: 50000.0000 [IU] | ORAL_CAPSULE | ORAL | 1 refills | Status: DC

## 2019-04-22 MED FILL — VIT D2 1.25 MG (50,000 UNIT: 1.25 MG | 84 days supply | Qty: 12 | Fill #0

## 2019-04-24 MED FILL — ELETRIPTAN HYDROBROMIDE 40: 40 | 30 days supply | Qty: 12 | Fill #0

## 2019-05-11 ENCOUNTER — Telehealth: Payer: Self-pay

## 2019-05-11 DIAGNOSIS — Z20822 Contact with and (suspected) exposure to covid-19: Secondary | ICD-10-CM

## 2019-05-11 NOTE — Telephone Encounter (Signed)
Pt scheduled for covid test

## 2019-05-13 ENCOUNTER — Other Ambulatory Visit: Payer: Medicaid Other

## 2019-05-16 ENCOUNTER — Emergency Department (HOSPITAL_BASED_OUTPATIENT_CLINIC_OR_DEPARTMENT_OTHER): Payer: Medicaid Other

## 2019-05-16 ENCOUNTER — Encounter (HOSPITAL_BASED_OUTPATIENT_CLINIC_OR_DEPARTMENT_OTHER): Payer: Self-pay

## 2019-05-16 ENCOUNTER — Other Ambulatory Visit: Payer: Self-pay

## 2019-05-16 ENCOUNTER — Emergency Department (HOSPITAL_BASED_OUTPATIENT_CLINIC_OR_DEPARTMENT_OTHER)
Admission: EM | Admit: 2019-05-16 | Discharge: 2019-05-16 | Disposition: A | Discharge status: Home / Self Care | Payer: Medicaid Other | Attending: Emergency Medicine | Admitting: Emergency Medicine

## 2019-05-16 DIAGNOSIS — Z888 Allergy status to other drugs, medicaments and biological substances status: Secondary | ICD-10-CM | POA: Diagnosis not present

## 2019-05-16 DIAGNOSIS — Z7982 Long term (current) use of aspirin: Secondary | ICD-10-CM | POA: Diagnosis not present

## 2019-05-16 DIAGNOSIS — Z885 Allergy status to narcotic agent status: Secondary | ICD-10-CM | POA: Insufficient documentation

## 2019-05-16 DIAGNOSIS — Z880 Allergy status to penicillin: Secondary | ICD-10-CM | POA: Insufficient documentation

## 2019-05-16 DIAGNOSIS — I10 Essential (primary) hypertension: Secondary | ICD-10-CM | POA: Diagnosis not present

## 2019-05-16 DIAGNOSIS — Z79899 Other long term (current) drug therapy: Secondary | ICD-10-CM | POA: Diagnosis not present

## 2019-05-16 DIAGNOSIS — R42 Dizziness and giddiness: Secondary | ICD-10-CM | POA: Diagnosis not present

## 2019-05-16 DIAGNOSIS — E782 Mixed hyperlipidemia: Secondary | ICD-10-CM | POA: Insufficient documentation

## 2019-05-16 LAB — CBC WITH DIFFERENTIAL/PLATELET
Abs Immature Granulocytes: 0.03 10*3/uL (ref 0.00–0.07)
Basophils Absolute: 0 10*3/uL (ref 0.0–0.1)
Basophils Relative: 0 %
Eosinophils Absolute: 0 10*3/uL (ref 0.0–0.5)
Eosinophils Relative: 0 %
HCT: 39.2 % (ref 36.0–46.0)
Hemoglobin: 12.3 g/dL (ref 12.0–15.0)
Immature Granulocytes: 0 %
Lymphocytes Relative: 40 %
Lymphs Abs: 2.9 10*3/uL (ref 0.7–4.0)
MCH: 26 pg (ref 26.0–34.0)
MCHC: 31.4 g/dL (ref 30.0–36.0)
MCV: 82.9 fL (ref 80.0–100.0)
Monocytes Absolute: 0.4 10*3/uL (ref 0.1–1.0)
Monocytes Relative: 6 %
Neutro Abs: 3.9 10*3/uL (ref 1.7–7.7)
Neutrophils Relative %: 54 %
Platelets: 264 10*3/uL (ref 150–400)
RBC: 4.73 MIL/uL (ref 3.87–5.11)
RDW: 14.5 % (ref 11.5–15.5)
WBC: 7.3 10*3/uL (ref 4.0–10.5)
nRBC: 0 % (ref 0.0–0.2)

## 2019-05-16 LAB — RAPID URINE DRUG SCREEN, HOSP PERFORMED
Amphetamines: NOT DETECTED
Barbiturates: POSITIVE — AB
Benzodiazepines: NOT DETECTED
Cocaine: NOT DETECTED
Opiates: NOT DETECTED
Tetrahydrocannabinol: NOT DETECTED

## 2019-05-16 LAB — COMPREHENSIVE METABOLIC PANEL
ALT: 30 U/L (ref 0–44)
AST: 28 U/L (ref 15–41)
Albumin: 4 g/dL (ref 3.5–5.0)
Alkaline Phosphatase: 98 U/L (ref 38–126)
Anion gap: 9 (ref 5–15)
BUN: 12 mg/dL (ref 6–20)
CO2: 26 mmol/L (ref 22–32)
Calcium: 9.8 mg/dL (ref 8.9–10.3)
Chloride: 102 mmol/L (ref 98–111)
Creatinine, Ser: 0.74 mg/dL (ref 0.44–1.00)
GFR calc Af Amer: >60 mL/min (ref >60)
GFR calc non Af Amer: >60 mL/min (ref >60)
Glucose, Bld: 96 mg/dL (ref 70–99)
Potassium: 3.5 mmol/L (ref 3.5–5.1)
Sodium: 137 mmol/L (ref 135–145)
Total Bilirubin: 0.5 mg/dL (ref 0.3–1.2)
Total Protein: 8.1 g/dL (ref 6.5–8.1)

## 2019-05-16 LAB — URINALYSIS, ROUTINE W REFLEX MICROSCOPIC
Bilirubin Urine: NEGATIVE
Glucose, UA: NEGATIVE mg/dL
Hgb urine dipstick: NEGATIVE
Ketones, ur: NEGATIVE mg/dL
Leukocytes,Ua: NEGATIVE
Nitrite: NEGATIVE
Protein, ur: NEGATIVE mg/dL
Specific Gravity, Urine: 1.02 (ref 1.005–1.030)
pH: 6 (ref 5.0–8.0)

## 2019-05-16 MED ORDER — MECLIZINE HCL 25 MG PO TABS: 25.0000 mg | ORAL_TABLET | Freq: Three times a day (TID) | ORAL | 0 refills | Status: DC | PRN

## 2019-05-16 MED ORDER — IOHEXOL 350 MG/ML SOLN
100.0000 mL | Freq: Once | INTRAVENOUS | Status: AC | PRN
  Administered 2019-05-16: 100 mL via INTRAVENOUS

## 2019-05-16 MED ORDER — SODIUM CHLORIDE 0.9 % IV BOLUS
1000.0000 mL | Freq: Once | INTRAVENOUS | Status: AC
  Administered 2019-05-16: 1000 mL via INTRAVENOUS

## 2019-05-16 MED ORDER — MECLIZINE HCL 25 MG PO TABS
25.0000 mg | ORAL_TABLET | Freq: Once | ORAL | Status: AC
  Administered 2019-05-16: 18:00:00 25 mg via ORAL
  Filled 2019-05-16: qty 1

## 2019-05-16 NOTE — Discharge Instructions (Signed)
It was strongly recommended you go to the [facility], ER tonight for MRI. We cannot rule out stroke otherwise. If your symptoms worsen, you develop new symptoms, or you change your mind, return to this or any other ER for evaluation.

## 2019-05-16 NOTE — ED Provider Notes (Signed)
[facility] EMERGENCY DEPARTMENT Provider Note   CSN: [id number] Arrival date & time: 05/16/19  1637     History Chief Complaint  Patient presents with  . Dizziness    Connie Arroyo is a 59 y.o. female.  HPI 59 year old female presents with dizziness.  Started last night when she was walking in the kitchen at 11 PM and she was running into the wall.  She also feels like she has been slapped in the left forehead.  When asked if this means headache, she seems to be indicating any numbness.  There is no headache.  No vision changes.  No vomiting but feels nauseated.  No weakness or numbness in extremities.  Dizziness was really bad for 4 hours but has never gone away.  Positions make it worse but no specific position.  It is present even at rest.   Past Medical History:  Diagnosis Date  . Allergy   . Anxiety   . High cholesterol   . History of stomach ulcers   . Hypercholesteremia   . Hypertension     There are no problems to display for this patient.   Past Surgical History:  Procedure Laterality Date  . ABDOMINAL HYSTERECTOMY  2002  . left shoulder surgery  2009  . TUBAL LIGATION  1986     OB History    Gravida  2   Para  2   Term  2   Preterm      AB      Living  2     SAB      TAB      Ectopic      Multiple      Live Births              Family History  Problem Relation Age of Onset  . Hypertension Mother   . Diabetes Father   . Hypertension Father   . Stroke Father   . Anuerysm Sister   . Stroke Sister   . Breast cancer Paternal Grandmother   . Breast cancer Paternal Aunt     Social History   Tobacco Use  . Smoking status: Never Smoker  . Smokeless tobacco: Never Used  Substance Use Topics  . Alcohol use: No  . Drug use: No    Home Medications Prior to Admission medications   Medication Sig Start Date End Date Taking? Authorizing Provider  aspirin 81 MG tablet Take 81 mg by mouth daily.    [provider]  eletriptan (RELPAX) 40 MG tablet Take 1 tablet (40 mg total) by mouth as needed for migraine or headache. May repeat in 2 hours if headache persists or recurs. 04/10/19   Claiborne Rigg, NP  escitalopram (LEXAPRO) 10 MG tablet Take 1 tablet (10 mg total) by mouth daily. 04/10/19   Claiborne Rigg, NP  IRON PO Take by mouth.    [provider]  meclizine (ANTIVERT) 25 MG tablet Take 1 tablet (25 mg total) by mouth 3 (three) times daily as needed for dizziness. 05/16/19   Pricilla Loveless, MD  memantine (NAMENDA) 10 MG tablet Take 10 mg by mouth 2 (two) times daily.    [provider]  pantoprazole (PROTONIX) 20 MG tablet Take 1 tablet (20 mg total) by mouth daily. 04/10/19   Claiborne Rigg, NP  rosuvastatin (CRESTOR) 5 MG tablet Take 1 tablet (5 mg total) by mouth daily. 04/10/19 07/09/19  Claiborne Rigg, NP  Vitamin D, Ergocalciferol, (  DRISDOL) 1.25 MG (50000 UNIT) CAPS capsule Take 1 capsule (50,000 Units total) by mouth every 7 (seven) days. 04/21/19   Claiborne Rigg, NP    Allergies    Benadryl [diphenhydramine], Codeine, and Penicillins  Review of Systems   Review of Systems  Constitutional: Negative for fever.  Gastrointestinal: Positive for nausea. Negative for vomiting.  Neurological: Positive for dizziness and numbness. Negative for headaches.  All other systems reviewed and are negative.   Physical Exam Updated Vital Signs BP (!) 148/95   Pulse 67   Temp 97.7 F (36.5 C) (Oral)   Resp 16   Wt 74.4 kg   SpO2 100%   BMI 28.15 kg/m   Physical Exam Vitals and nursing note reviewed.  Constitutional:      General: She is not in acute distress.    Appearance: She is well-developed. She is not ill-appearing.  HENT:     Head: Normocephalic and atraumatic.     Right Ear: External ear normal.     Left Ear: External ear normal.     Nose: Nose normal.  Eyes:     General:        Right eye: No discharge.        Left eye: No discharge.      Extraocular Movements: Extraocular movements intact.     Pupils: Pupils are equal, round, and reactive to light.  Cardiovascular:     Rate and Rhythm: Normal rate and regular rhythm.     Heart sounds: Normal heart sounds.  Pulmonary:     Effort: Pulmonary effort is normal.     Breath sounds: Normal breath sounds.  Abdominal:     Palpations: Abdomen is soft.     Tenderness: There is no abdominal tenderness.  Skin:    General: Skin is warm and dry.  Neurological:     Mental Status: She is alert.     Comments: CN 3-12 grossly intact save for subjective decreased sensation to left face. 5/5 strength in all 4 extremities. Grossly normal sensation. Normal finger to nose. Able to ambulate but after a few steps she is too dizzy to walk any more  Psychiatric:        Mood and Affect: Mood is not anxious.     ED Results / Procedures / Treatments   Labs (all labs ordered are listed, but only abnormal results are displayed) Labs Reviewed  RAPID URINE DRUG SCREEN, HOSP PERFORMED - Abnormal; Notable for the following components:      Result Value   Barbiturates POSITIVE (*)    All other components within normal limits  URINALYSIS, ROUTINE W REFLEX MICROSCOPIC  COMPREHENSIVE METABOLIC PANEL  CBC WITH DIFFERENTIAL/PLATELET  CBG MONITORING, ED    EKG EKG Interpretation  Date/Time:  Thursday May 16 2019 16:44:55 EST Ventricular Rate:  70 PR Interval:  126 QRS Duration: 72 QT Interval:  372 QTC Calculation: 401 R Axis:   62 Text Interpretation: Normal sinus rhythm Nonspecific T wave abnormality Abnormal ECG Confirmed by Pricilla Loveless [phone]) on 05/16/2019 4:51:02 PM   Radiology CT Angio Head W or Wo Contrast  Result Date: 05/16/2019 CLINICAL DATA:  Dizziness EXAM: CT ANGIOGRAPHY HEAD AND NECK TECHNIQUE: Multidetector CT imaging of the head and neck was performed using the standard protocol during bolus administration of intravenous contrast. Multiplanar CT image reconstructions and  MIPs were obtained to evaluate the vascular anatomy. Carotid stenosis measurements (when applicable) are obtained utilizing NASCET criteria, using the distal internal carotid diameter as the  denominator. CONTRAST:  182mL OMNIPAQUE IOHEXOL 350 MG/ML SOLN COMPARISON:  None. FINDINGS: CT HEAD Brain: There is no acute intracranial hemorrhage, mass effect, or edema. Gray-white differentiation is preserved. There is no extra-axial fluid collection. Ventricles and sulci are within normal limits in size and configuration. Vascular: No hyperdense vessel or unexpected calcification. Skull: Calvarium is unremarkable. Sinuses/Orbits: No acute finding. Other: None. Review of the MIP images confirms the above findings CTA NECK Aortic arch: Minimal calcified plaque. Great vessel origins are patent. Right carotid system: Patent. No measurable stenosis or evidence of dissection. Left carotid system: Patent. No measurable stenosis or evidence of dissection. Vertebral arteries: Patent. Right vertebral artery is slightly dominant. Skeleton: Minor degenerative changes of the cervical spine. Other neck: No mass or adenopathy. Upper chest: No apical lung mass. Review of the MIP images confirms the above findings CTA HEAD Anterior circulation: Intracranial internal carotid arteries are patent with trace calcified plaque. Anterior and middle cerebral arteries are patent. An anterior communicating artery is present. Posterior circulation: Intracranial vertebral arteries, basilar artery, and posterior cerebral arteries are patent. Bilateral posterior communicating arteries are present. Venous sinuses: As permitted by contrast timing, patent. Review of the MIP images confirms the above findings IMPRESSION: No acute intracranial hemorrhage or evidence of acute infarction. No large vessel occlusion, hemodynamically significant stenosis, or evidence of dissection. Electronically Signed   By: Macy Mis M.D.   On: 05/16/2019 19:25   CT Angio  Neck W and/or Wo Contrast  Result Date: 05/16/2019 CLINICAL DATA:  Dizziness EXAM: CT ANGIOGRAPHY HEAD AND NECK TECHNIQUE: Multidetector CT imaging of the head and neck was performed using the standard protocol during bolus administration of intravenous contrast. Multiplanar CT image reconstructions and MIPs were obtained to evaluate the vascular anatomy. Carotid stenosis measurements (when applicable) are obtained utilizing NASCET criteria, using the distal internal carotid diameter as the denominator. CONTRAST:  135mL OMNIPAQUE IOHEXOL 350 MG/ML SOLN COMPARISON:  None. FINDINGS: CT HEAD Brain: There is no acute intracranial hemorrhage, mass effect, or edema. Gray-white differentiation is preserved. There is no extra-axial fluid collection. Ventricles and sulci are within normal limits in size and configuration. Vascular: No hyperdense vessel or unexpected calcification. Skull: Calvarium is unremarkable. Sinuses/Orbits: No acute finding. Other: None. Review of the MIP images confirms the above findings CTA NECK Aortic arch: Minimal calcified plaque. Great vessel origins are patent. Right carotid system: Patent. No measurable stenosis or evidence of dissection. Left carotid system: Patent. No measurable stenosis or evidence of dissection. Vertebral arteries: Patent. Right vertebral artery is slightly dominant. Skeleton: Minor degenerative changes of the cervical spine. Other neck: No mass or adenopathy. Upper chest: No apical lung mass. Review of the MIP images confirms the above findings CTA HEAD Anterior circulation: Intracranial internal carotid arteries are patent with trace calcified plaque. Anterior and middle cerebral arteries are patent. An anterior communicating artery is present. Posterior circulation: Intracranial vertebral arteries, basilar artery, and posterior cerebral arteries are patent. Bilateral posterior communicating arteries are present. Venous sinuses: As permitted by contrast timing, patent.  Review of the MIP images confirms the above findings IMPRESSION: No acute intracranial hemorrhage or evidence of acute infarction. No large vessel occlusion, hemodynamically significant stenosis, or evidence of dissection. Electronically Signed   By: Macy Mis M.D.   On: 05/16/2019 19:25    Procedures Procedures (including critical care time)  Medications Ordered in ED Medications  sodium chloride 0.9 % bolus 1,000 mL (0 mLs Intravenous Stopped 05/16/19 2004)  meclizine (ANTIVERT) tablet 25 mg (25 mg  Oral Given 05/16/19 1749)  iohexol (OMNIPAQUE) 350 MG/ML injection 100 mL (100 mLs Intravenous Contrast Given 05/16/19 1843)    ED Course  I have reviewed the triage vital signs and the nursing notes.  Pertinent labs & imaging results that were available during my care of the patient were reviewed by me and considered in my medical decision making (see chart for details).    MDM Rules/Calculators/A&P                      Patient is feeling better with Antivert.  She is able to ambulate prior to this though feels off balance and had to sit down.  With the left face numbness, I recommended she get an MRI.  There is no obvious dissection or aneurysm on CT and there is no real headache to be concern for subarachnoid hemorrhage.  However I strongly recommended MRI.  She understands my reasoning including stroke, neurologic disability or swelling/death but does not want to go to [facility] for MRI.  She states she has PCP follow-up tomorrow and will go to this.  Counseled on how I cannot differentiate peripheral versus central vertigo in this specific case without MRI.  She seems to understand.  We discussed return precautions and that she may return at any time and is encouraged to do so. Final Clinical Impression(s) / ED Diagnoses Final diagnoses:  Vertigo    Rx / DC Orders ED Discharge Orders         Ordered    meclizine (ANTIVERT) 25 MG tablet  3 times daily PRN     05/16/19 1945            Pricilla Loveless, MD 05/16/19 2010

## 2019-05-16 NOTE — ED Triage Notes (Signed)
Pt c/o dizziness, nausea that started yesterday-states felt like the room was spinning-NAD-steady gait

## 2019-05-17 ENCOUNTER — Ambulatory Visit: Payer: Medicaid Other | Attending: Family | Admitting: Family

## 2019-05-17 VITALS — BP 123/72 | HR 85 | Ht 64.0 in | Wt 166.0 lb

## 2019-05-17 DIAGNOSIS — I10 Essential (primary) hypertension: Secondary | ICD-10-CM | POA: Diagnosis not present

## 2019-05-17 DIAGNOSIS — Z79899 Other long term (current) drug therapy: Secondary | ICD-10-CM | POA: Diagnosis not present

## 2019-05-17 DIAGNOSIS — E7800 Pure hypercholesterolemia, unspecified: Secondary | ICD-10-CM | POA: Insufficient documentation

## 2019-05-17 DIAGNOSIS — R519 Headache, unspecified: Secondary | ICD-10-CM | POA: Diagnosis not present

## 2019-05-17 DIAGNOSIS — F419 Anxiety disorder, unspecified: Secondary | ICD-10-CM | POA: Insufficient documentation

## 2019-05-17 DIAGNOSIS — Z7982 Long term (current) use of aspirin: Secondary | ICD-10-CM | POA: Insufficient documentation

## 2019-05-17 DIAGNOSIS — R42 Dizziness and giddiness: Secondary | ICD-10-CM | POA: Diagnosis present

## 2019-05-17 LAB — CBG MONITORING, ED: Glucose-Capillary: 75 mg/dL (ref 70–99)

## 2019-05-17 MED FILL — MECLIZINE 25 MG TABLET: 25 | 5 days supply | Qty: 15 | Fill #0

## 2019-05-17 NOTE — Patient Instructions (Signed)
Report to [facility] Radiology for MRI Brain.  Vertigo Vertigo is the feeling that you or the things around you are moving when they are not. This feeling can come and go at any time. Vertigo often goes away on its own. This condition can be dangerous if it happens when you are doing activities like driving or working with machines. Your doctor will do tests to find the cause of your vertigo. These tests will also help your doctor decide on the best treatment for you. Follow these instructions at home: Eating and drinking      Drink enough fluid to keep your pee (urine) pale yellow.  Do not drink alcohol. Activity  Return to your normal activities as told by your doctor. Ask your doctor what activities are safe for you.  In the morning, first sit up on the side of the bed. When you feel okay, stand slowly while you hold onto something until you know that your balance is fine.  Move slowly. Avoid sudden body or head movements or certain positions, as told by your doctor.  Use a cane if you have trouble standing or walking.  Sit down right away if you feel dizzy.  Avoid doing any tasks or activities that can cause danger to you or others if you get dizzy.  Avoid bending down if you feel dizzy. Place items in your home so that they are easy for you to reach without leaning over.  Do not drive or use heavy machinery if you feel dizzy. General instructions  Take over-the-counter and prescription medicines only as told by your doctor.  Keep all follow-up visits as told by your doctor. This is important. Contact a doctor if:  Your medicine does not help your vertigo.  You have a fever.  Your problems get worse or you have new symptoms.  Your family or friends see changes in your behavior.  The feeling of being sick to your stomach gets worse.  Your vomiting gets worse.  You lose feeling (have numbness) in part of your body.  You feel prickling and tingling in a  part of your body. Get help right away if:  You have trouble moving or talking.  You are always dizzy.  You pass out (faint).  You get very bad headaches.  You feel weak in your hands, arms, or legs.  You have changes in your hearing.  You have changes in how you see (vision).  You get a stiff neck.  Bright light starts to bother you. Summary  Vertigo is the feeling that you or the things around you are moving when they are not.  Your doctor will do tests to find the cause of your vertigo.  You may be told to avoid some tasks, positions, or movements.  Contact a doctor if your medicine is not helping, or if you have a fever, new symptoms, or a change in behavior.  Get help right away if you get very bad headaches, or if you have changes in how you speak, hear, or see. This information is not intended to replace advice given to you by your health care provider. Make sure you discuss any questions you have with your health care provider. Document Revised: 01/15/2018 Document Reviewed: 01/15/2018 Elsevier Patient Education  2020 [employer].

## 2019-05-17 NOTE — Progress Notes (Signed)
Patient ID: Connie Arroyo, female    DOB: 1961/03/06  MRN: [id number]  CC: Dizziness and headaches  Subjective: Connie Arroyo is a 59 y.o. female with history of allergy, anxiety, high cholesterol, stomach ulcers, hypercholesteremia, and hypertension who presents for dizziness and headaches.    1. DIZZINESS FOLLOW-UP: Dizziness began 2 days ago. Has no history of dizziness prior to 2 days ago. Currently dizzy during today's visit. When patient ambulated from chair to exam table during today's visit she had to pause for a couple of minutes because she felt as if the room was spinning. Sister drove her to this appointment.   Reports she has a headache today as well. Location of headache is frontal with no radiation. Rates 10/10 on average. Reports she also has history of migraines and was prescribed medication for it in the past but never filled the prescription because she had to pay over $100 out of pocket. Takes Tylenol or Ibuprofen for headaches as needed which helps some. Headaches happen at least every 1 to 2 days. Sates she can feel fluid moving in head when she has a headache.   Denies facial numbness and tingling, smoking, alcohol consumption, vision change, chest pain, and shortness of breath.    Reports bursa sac rupture in left upper shoulder and left wrist fracture in 2009 and has decreased strength on that side since then.   Last visit at [facility] on yesterday with Dr. Criss Alvine. During that encounter patient was administered Antivert which helped and CT resulted with no obvious dissection or aneurysm. It was strongly recommended she receive an MRI to rule out stroke, neurologic disability or swelling/death as she presented with left facial numbness.  Patient was not agreeable to go to [facility] for MRI at that time and preferred to follow-up with PCP the next day. Prescribed Antivert 25 mg to be taken 3 times daily as needed for outpatient  management. Patient has not taken Antivert today as she is using the clinic pharmacy to get her prescription filled during this visit. Reports dizziness and headaches are the same since visit to the emergency department on yesterday.   Current Outpatient Medications on File Prior to Visit  Medication Sig Dispense Refill  . aspirin 81 MG tablet Take 81 mg by mouth daily.    Marland Kitchen eletriptan (RELPAX) 40 MG tablet Take 1 tablet (40 mg total) by mouth as needed for migraine or headache. May repeat in 2 hours if headache persists or recurs. 12 tablet 6  . escitalopram (LEXAPRO) 10 MG tablet Take 1 tablet (10 mg total) by mouth daily. 90 tablet 1  . IRON PO Take by mouth.    . meclizine (ANTIVERT) 25 MG tablet Take 1 tablet (25 mg total) by mouth 3 (three) times daily as needed for dizziness. 15 tablet 0  . memantine (NAMENDA) 10 MG tablet Take 10 mg by mouth 2 (two) times daily.    . pantoprazole (PROTONIX) 20 MG tablet Take 1 tablet (20 mg total) by mouth daily. 90 tablet 1  . rosuvastatin (CRESTOR) 5 MG tablet Take 1 tablet (5 mg total) by mouth daily. 90 tablet 1  . Vitamin D, Ergocalciferol, (DRISDOL) 1.25 MG (50000 UNIT) CAPS capsule Take 1 capsule (50,000 Units total) by mouth every 7 (seven) days. 12 capsule 1   No current facility-administered medications on file prior to visit.    Allergies  Allergen Reactions  . Benadryl [Diphenhydramine] Other (See Comments)    Agitation  and hallucinations  . Codeine Nausea And Vomiting    No rash, swelling or other allergic sx  . Penicillins     Social History   Socioeconomic History  . Marital status: Single    Spouse name: Not on file  . Number of children: Not on file  . Years of education: Not on file  . Highest education level: Not on file  Occupational History  . Not on file  Tobacco Use  . Smoking status: Never Smoker  . Smokeless tobacco: Never Used  Substance and Sexual Activity  . Alcohol use: No  . Drug use: No  . Sexual  activity: Not on file  Other Topics Concern  . Not on file  Social History Narrative  . Not on file   Social Determinants of Health   Financial Resource Strain:   . Difficulty of Paying Living Expenses:   Food Insecurity:   . Worried About Charity fundraiser in the Last Year:   . Arboriculturist in the Last Year:   Transportation Needs:   . Film/video editor (Medical):   Marland Kitchen Lack of Transportation (Non-Medical):   Physical Activity:   . Days of Exercise per Week:   . Minutes of Exercise per Session:   Stress:   . Feeling of Stress :   Social Connections:   . Frequency of Communication with Friends and Family:   . Frequency of Social Gatherings with Friends and Family:   . Attends Religious Services:   . Active Member of Clubs or Organizations:   . Attends Archivist Meetings:   Marland Kitchen Marital Status:   Intimate Partner Violence:   . Fear of Current or Ex-Partner:   . Emotionally Abused:   Marland Kitchen Physically Abused:   . Sexually Abused:     Family History  Problem Relation Age of Onset  . Hypertension Mother   . Diabetes Father   . Hypertension Father   . Stroke Father   . Anuerysm Sister   . Stroke Sister   . Breast cancer Paternal Grandmother   . Breast cancer Paternal Aunt     Past Surgical History:  Procedure Laterality Date  . ABDOMINAL HYSTERECTOMY  2002  . left shoulder surgery  2009  . TUBAL LIGATION  1986    ROS: Review of Systems Negative except as stated above  PHYSICAL EXAM: Vitals with BMI 05/17/2019 05/16/2019 05/16/2019  Height 5\' 4"  - -  Weight 166 lbs - -  BMI 67.67 - -  Systolic 209 470 -  Diastolic 72 95 -  Pulse 85 - 67    Physical Exam General appearance - alert, well appearing, and in no distress and oriented to person, place, and time Mental status - alert, oriented to person, place, and time, normal mood, behavior, speech, dress, motor activity, and thought processes Eyes - pupils equal and reactive, extraocular eye  movements intact Chest - clear to auscultation, no wheezes, rales or rhonchi, symmetric air entry, no tachypnea, retractions or cyanosis Heart - normal rate, regular rhythm, normal S1, S2, no murmurs, rubs, clicks or gallops Neurological - alert, oriented, normal speech, no focal findings or movement disorder noted, DTR's normal and symmetric, motor and sensory grossly normal bilaterally, normal muscle tone, no tremors, strength 4/5 of left upper extremity, strength RUE and BLE 5/5, cranial nerves of eye not able to be completed as this made dizziness worse   CMP Latest Ref Rng & Units 05/16/2019 04/17/2019 11/01/2017  Glucose 70 -  99 mg/dL 96 91 92  BUN 6 - 20 mg/dL 12 10 11   Creatinine 0.44 - 1.00 mg/dL 7.85 8.85  Sodium 135 - 145 mmol/L 137 139 143  Potassium 3.5 - 5.1 mmol/L 3.5 4.2 3.9  Chloride 98 - 111 mmol/L 102 98 101  CO2 22 - 32 mmol/L 26 24 25   Calcium 8.9 - 10.3 mg/dL 9.8 10.5(H) 10.4(H)  Total Protein 6.5 - 8.1 g/dL 8.1 7.7 7.8  Total Bilirubin 0.3 - 1.2 mg/dL 0.5 0.3 0.3  Alkaline Phos 38 - 126 U/L 98 121(H) 109  AST 15 - 41 U/L 28 19 30   ALT 0 - 44 U/L 30 17 33(H)   Lipid Panel     Component Value Date/Time   CHOL 265 (H) 04/17/2019 1515   TRIG 86 04/17/2019 1515   HDL 92 04/17/2019 1515   CHOLHDL 2.9 04/17/2019 1515   CHOLHDL 3.3 08/29/2014 0825   VLDL 13 08/29/2014 0825   LDLCALC 159 (H) 04/17/2019 1515    CBC    Component Value Date/Time   WBC 7.3 05/16/2019 1729   RBC 4.73 05/16/2019 1729   HGB 12.3 05/16/2019 1729   HGB 13.2 04/17/2019 1515   HCT 39.2 05/16/2019 1729   HCT 41.4 04/17/2019 1515   PLT 264 05/16/2019 1729   PLT 298 04/17/2019 1515   MCV 82.9 05/16/2019 1729   MCV 81 04/17/2019 1515   MCH 26.0 05/16/2019 1729   MCHC 31.4 05/16/2019 1729   RDW 14.5 05/16/2019 1729   RDW 14.1 04/17/2019 1515   LYMPHSABS 2.9 05/16/2019 1729   MONOABS 0.4 05/16/2019 1729   EOSABS 0.0 05/16/2019 1729   BASOSABS 0.0 05/16/2019 1729    ASSESSMENT  AND PLAN: 1. Vertigo:  - MR Brain Wo Contrast; Future - I agree with the recommendations of Dr. 07/16/2019 that the patient needs an MRI of the brain to assist with differentiation of peripheral versus central vertigo. -Consulted with [facility] Radiology Dr. 07/16/2019 to confirm that imaging without contrast is acceptable. -Patient has been scheduled for imaging at the earliest available date located at [facility] on May 29, 2019. -Counseled patient on the importance of seeking immediate medical attention at the emergency department if symptoms become worse or severe.  -Take Antivert as needed according to prescription instructions. -Follow-up with primary provider as needed.  Patient was given the opportunity to ask questions.  Patient verbalized understanding of the plan and was able to repeat key elements of the plan. Patient was given clear instructions to go to Emergency Department or return to medical center if symptoms don't improve, worsen, or new problems develop.The patient verbalized understanding.   Requested Prescriptions    No prescriptions requested or ordered in this encounter    Coren Sagan Allena Katz, NP

## 2019-05-22 ENCOUNTER — Telehealth: Payer: Self-pay

## 2019-05-22 NOTE — Telephone Encounter (Signed)
Went on the [employer] and started prior auth for MRI Brain WO Contrast   CPT- [id number] MRI Brain WO Contrast ICD- R42 Vertigo    Went through the questions and per [employer] "The case has been sent to [employer] for further review"  Service Number- [id number]  Pt is scheduled for MRI on March 24th at 7pm  Amy may want to call and do peer to peer to get study approved   [employer] [phone]

## 2019-05-24 NOTE — Telephone Encounter (Signed)
Peer to peer complete. Spoke with [employer] clinical representative and provided information.   Authorization# [id number] expiration date: 11/18/2019

## 2019-05-27 ENCOUNTER — Telehealth: Payer: Self-pay | Admitting: Nurse Practitioner

## 2019-05-27 ENCOUNTER — Other Ambulatory Visit: Payer: Self-pay | Admitting: Nurse Practitioner

## 2019-05-27 MED ORDER — HYDROXYZINE HCL 50 MG PO TABS: 50.0000 mg | ORAL_TABLET | Freq: Once | ORAL | 0 refills | Status: DC

## 2019-05-27 MED FILL — NORTRIPTYLINE HCL 10 MG CAP: 10 | 30 days supply | Qty: 90 | Fill #2

## 2019-05-27 NOTE — Telephone Encounter (Signed)
pt would like something to keep her calm in the MRI on wed. And would like it sent to [facility]

## 2019-05-27 NOTE — Telephone Encounter (Signed)
Medication sent. Hydroxyzine

## 2019-05-28 MED FILL — hydrOXYzine HCL 50 MG TABS: 50 | 1 days supply | Qty: 1 | Fill #0

## 2019-05-28 NOTE — Telephone Encounter (Signed)
Called pt lvm .

## 2019-05-29 ENCOUNTER — Other Ambulatory Visit: Payer: Self-pay

## 2019-05-29 ENCOUNTER — Ambulatory Visit: Payer: Medicaid Other

## 2019-05-29 ENCOUNTER — Ambulatory Visit (HOSPITAL_COMMUNITY)
Admission: RE | Admit: 2019-05-29 | Discharge: 2019-05-29 | Disposition: A | Discharge status: Home / Self Care | Payer: Medicaid Other | Source: Ambulatory Visit | Attending: Family | Admitting: Family

## 2019-05-29 ENCOUNTER — Ambulatory Visit (HOSPITAL_COMMUNITY): Payer: Medicaid Other

## 2019-05-29 DIAGNOSIS — R42 Dizziness and giddiness: Secondary | ICD-10-CM

## 2019-06-03 MED FILL — ESCITALOPRAM 10 MG TABLET: 10 | 30 days supply | Qty: 30 | Fill #1

## 2019-06-10 MED FILL — ROSUVASTATIN CALCIUM 5 MG T: 5 | 30 days supply | Qty: 30 | Fill #1

## 2019-06-10 MED FILL — PANTOPRAZOLE SOD DR 20 MG T: 20 | 30 days supply | Qty: 30 | Fill #1

## 2019-06-11 ENCOUNTER — Other Ambulatory Visit: Payer: Self-pay

## 2019-06-11 ENCOUNTER — Ambulatory Visit: Payer: Medicaid Other | Attending: Nurse Practitioner | Admitting: Nurse Practitioner

## 2019-06-11 DIAGNOSIS — R42 Dizziness and giddiness: Secondary | ICD-10-CM | POA: Diagnosis present

## 2019-06-11 DIAGNOSIS — Z803 Family history of malignant neoplasm of breast: Secondary | ICD-10-CM | POA: Diagnosis not present

## 2019-06-11 DIAGNOSIS — Z8711 Personal history of peptic ulcer disease: Secondary | ICD-10-CM | POA: Insufficient documentation

## 2019-06-11 DIAGNOSIS — Z833 Family history of diabetes mellitus: Secondary | ICD-10-CM | POA: Diagnosis not present

## 2019-06-11 DIAGNOSIS — Z135 Encounter for screening for eye and ear disorders: Secondary | ICD-10-CM | POA: Diagnosis not present

## 2019-06-11 DIAGNOSIS — E7800 Pure hypercholesterolemia, unspecified: Secondary | ICD-10-CM | POA: Insufficient documentation

## 2019-06-11 DIAGNOSIS — Z8249 Family history of ischemic heart disease and other diseases of the circulatory system: Secondary | ICD-10-CM | POA: Diagnosis not present

## 2019-06-11 DIAGNOSIS — I10 Essential (primary) hypertension: Secondary | ICD-10-CM | POA: Insufficient documentation

## 2019-06-11 DIAGNOSIS — R430 Anosmia: Secondary | ICD-10-CM | POA: Insufficient documentation

## 2019-06-11 DIAGNOSIS — Z0100 Encounter for examination of eyes and vision without abnormal findings: Secondary | ICD-10-CM

## 2019-06-11 DIAGNOSIS — Z823 Family history of stroke: Secondary | ICD-10-CM | POA: Diagnosis not present

## 2019-06-11 NOTE — Progress Notes (Signed)
Virtual Visit via Telephone Note Due to national recommendations of social distancing due to COVID 19, telehealth visit is felt to be most appropriate for this patient at this time.  I discussed the limitations, risks, security and privacy concerns of performing an evaluation and management service by telephone and the availability of in person appointments. I also discussed with the patient that there may be a patient responsible charge related to this service. The patient expressed understanding and agreed to proceed.    I connected with Connie Arroyo on 06/11/19  at   1:30 PM EDT  EDT by telephone and verified that I am speaking with the correct person using two identifiers.   Consent I discussed the limitations, risks, security and privacy concerns of performing an evaluation and management service by telephone and the availability of in person appointments. I also discussed with the patient that there may be a patient responsible charge related to this service. The patient expressed understanding and agreed to proceed.   Location of Patient: Private Residence    [facility] of Provider: [facility]    Persons participating in Telemedicine visit: [name] FNP-BC YY [place] CMA Connie Arroyo    History of Present Illness: Telemedicine visit for: Vertigo  Taking antivert for dizziness with no improvement. MRI head negative. Dizziness started approximately 3-4 weeks ago. Sudden onset. Associated symptoms: affecting balance at times.  Aggravating factors: motion, changing positions. Relieving factors: None. She does have a history of environmental allergies.  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 Will refer again today for anosmia and vertigo.    Past Medical History:  Diagnosis Date  . Allergy   . Anxiety   . High cholesterol   . History of stomach ulcers   . Hypercholesteremia   . Hypertension     Past  Surgical History:  Procedure Laterality Date  . ABDOMINAL HYSTERECTOMY  2002  . left shoulder surgery  2009  . TUBAL LIGATION  1986    Family History  Problem Relation Age of Onset  . Hypertension Mother   . Diabetes Father   . Hypertension Father   . Stroke Father   . Anuerysm Sister   . Stroke Sister   . Breast cancer Paternal Grandmother   . Breast cancer Paternal Aunt     Social History   Socioeconomic History  . Marital status: Single    Spouse name: Not on file  . Number of children: Not on file  . Years of education: Not on file  . Highest education level: Not on file  Occupational History  . Not on file  Tobacco Use  . Smoking status: Never Smoker  . Smokeless tobacco: Never Used  Substance and Sexual Activity  . Alcohol use: No  . Drug use: No  . Sexual activity: Not on file  Other Topics Concern  . Not on file  Social History Narrative  . Not on file   Social Determinants of Health   Financial Resource Strain:   . Difficulty of Paying Living Expenses:   Food Insecurity:   . Worried About Programme researcher, broadcasting/film/video in the Last Year:   . Barista in the Last Year:   Transportation Needs:   . Freight forwarder (Medical):   Marland Kitchen Lack of Transportation (Non-Medical):   Physical Activity:   . Days of Exercise per Week:   . Minutes of Exercise per Session:  Stress:   . Feeling of Stress :   Social Connections:   . Frequency of Communication with Friends and Family:   . Frequency of Social Gatherings with Friends and Family:   . Attends Religious Services:   . Active Member of Clubs or Organizations:   . Attends Archivist Meetings:   Marland Kitchen Marital Status:      Observations/Objective: Awake, alert and oriented x 3   Review of Systems  Constitutional: Negative for fever, malaise/fatigue and weight loss.  HENT: Negative.  Negative for nosebleeds.        Anosmia  Eyes: Negative.  Negative for blurred vision, double vision and photophobia.   Respiratory: Negative.  Negative for cough and shortness of breath.   Cardiovascular: Negative.  Negative for chest pain, palpitations and leg swelling.  Gastrointestinal: Negative.  Negative for heartburn, nausea and vomiting.  Musculoskeletal: Negative.  Negative for myalgias.  Neurological: Positive for dizziness. Negative for focal weakness, seizures and headaches.  Psychiatric/Behavioral: Negative.  Negative for suicidal ideas.    Assessment and Plan: Aamiyah was seen today for follow-up.  Diagnoses and all orders for this visit:  Vertigo -     Ambulatory referral to ENT Continue meclizine  Encounter for routine eye and vision examination -     Ambulatory referral to Ophthalmology     Follow Up Instructions No follow-ups on file.     I discussed the assessment and treatment plan with the patient. The patient was provided an opportunity to ask questions and all were answered. The patient agreed with the plan and demonstrated an understanding of the instructions.   The patient was advised to call back or seek an in-person evaluation if the symptoms worsen or if the condition fails to improve as anticipated.  I provided 16 minutes of non-face-to-face time during this encounter including median intraservice time, reviewing previous notes, labs, imaging, medications and explaining diagnosis and management.  Gildardo Pounds, FNP-BC

## 2019-06-12 ENCOUNTER — Encounter: Payer: Self-pay | Admitting: Nurse Practitioner

## 2019-06-18 ENCOUNTER — Ambulatory Visit: Payer: Medicaid Other

## 2019-06-19 MED FILL — IBUPROFEN 600 MG TABLET: 600 | 7 days supply | Qty: 28 | Fill #0

## 2019-06-19 MED FILL — oxyCODONE HCL 5 MG TABS: 5 | 1 days supply | Qty: 3 | Fill #0

## 2019-06-19 MED FILL — CHLORHEXIDINE 0.12% RINSE: 0.12 | 18 days supply | Qty: 473 | Fill #0

## 2019-06-20 ENCOUNTER — Inpatient Hospital Stay: Admission: RE | Admit: 2019-06-20 | Payer: Medicaid Other | Source: Ambulatory Visit

## 2019-06-27 ENCOUNTER — Ambulatory Visit: Payer: Medicaid Other

## 2019-06-28 ENCOUNTER — Ambulatory Visit
Admission: RE | Admit: 2019-06-28 | Discharge: 2019-06-28 | Disposition: A | Discharge status: Home / Self Care | Payer: Medicaid Other | Source: Ambulatory Visit | Attending: Nurse Practitioner | Admitting: Nurse Practitioner

## 2019-06-28 ENCOUNTER — Other Ambulatory Visit: Payer: Self-pay

## 2019-06-28 DIAGNOSIS — Z1231 Encounter for screening mammogram for malignant neoplasm of breast: Secondary | ICD-10-CM

## 2019-07-02 ENCOUNTER — Telehealth: Payer: Self-pay | Admitting: Nurse Practitioner

## 2019-07-02 NOTE — Telephone Encounter (Signed)
Patient called and requested for mammogram results. Patient was identified by 2 patient identifiers. Patient verbalized understanding and had no further questions or concerns at this time.

## 2019-07-08 ENCOUNTER — Ambulatory Visit: Payer: Medicaid Other | Attending: Nurse Practitioner | Admitting: Nurse Practitioner

## 2019-07-08 ENCOUNTER — Encounter: Payer: Self-pay | Admitting: Nurse Practitioner

## 2019-07-08 ENCOUNTER — Other Ambulatory Visit: Payer: Self-pay

## 2019-07-08 VITALS — BP 115/81 | HR 76 | Temp 98.1°F | Resp 17 | Ht 64.0 in | Wt 159.0 lb

## 2019-07-08 DIAGNOSIS — Z0000 Encounter for general adult medical examination without abnormal findings: Secondary | ICD-10-CM | POA: Diagnosis not present

## 2019-07-08 DIAGNOSIS — Z79899 Other long term (current) drug therapy: Secondary | ICD-10-CM | POA: Diagnosis not present

## 2019-07-08 DIAGNOSIS — Z1159 Encounter for screening for other viral diseases: Secondary | ICD-10-CM | POA: Diagnosis not present

## 2019-07-08 DIAGNOSIS — I10 Essential (primary) hypertension: Secondary | ICD-10-CM | POA: Insufficient documentation

## 2019-07-08 DIAGNOSIS — Z1211 Encounter for screening for malignant neoplasm of colon: Secondary | ICD-10-CM | POA: Diagnosis not present

## 2019-07-08 DIAGNOSIS — Z114 Encounter for screening for human immunodeficiency virus [HIV]: Secondary | ICD-10-CM | POA: Insufficient documentation

## 2019-07-08 DIAGNOSIS — F419 Anxiety disorder, unspecified: Secondary | ICD-10-CM | POA: Insufficient documentation

## 2019-07-08 DIAGNOSIS — Z8249 Family history of ischemic heart disease and other diseases of the circulatory system: Secondary | ICD-10-CM | POA: Diagnosis not present

## 2019-07-08 DIAGNOSIS — Z7982 Long term (current) use of aspirin: Secondary | ICD-10-CM | POA: Diagnosis not present

## 2019-07-08 DIAGNOSIS — E7800 Pure hypercholesterolemia, unspecified: Secondary | ICD-10-CM | POA: Insufficient documentation

## 2019-07-08 NOTE — Progress Notes (Signed)
Assessment & Plan:  Connie Arroyo was seen today for annual exam.  Diagnoses and all orders for this visit:  Encounter for annual physical exam  Colon cancer screening -     Fecal occult blood, imunochemical([facility])  Need for hepatitis C screening test -     Hepatitis C Antibody  Encounter for screening for HIV -     HIV antibody (with reflex)    Patient has been counseled on age-appropriate routine health concerns for screening and prevention. These are reviewed and up-to-date. Referrals have been placed accordingly. Immunizations are up-to-date or declined.    Subjective:   Chief Complaint  Patient presents with  . Annual Exam    patient is fasting   HPI Connie Arroyo 59 y.o. female presents to office today for annual physical.   Review of Systems  Constitutional: Negative for fever, malaise/fatigue and weight loss.  HENT: Negative.  Negative for nosebleeds.   Eyes: Negative.  Negative for blurred vision, double vision and photophobia.  Respiratory: Negative.  Negative for cough and shortness of breath.   Cardiovascular: Negative.  Negative for chest pain, palpitations and leg swelling.  Gastrointestinal: Negative.  Negative for heartburn, nausea and vomiting.  Genitourinary: Negative.   Musculoskeletal: Positive for joint pain (right knee pain). Negative for myalgias.  Skin: Negative.   Neurological: Negative.  Negative for dizziness, focal weakness, seizures and headaches.  Endo/Heme/Allergies: Positive for environmental allergies.  Psychiatric/Behavioral: Negative for suicidal ideas. The patient is nervous/anxious (notes increased stress with 9 yr old granddaughter who is living in the home. Very disrepectful per patient. ).     Past Medical History:  Diagnosis Date  . Allergy   . Anxiety   . High cholesterol   . History of stomach ulcers   . Hypercholesteremia   . Hypertension     Past Surgical History:  Procedure Laterality Date  . ABDOMINAL  HYSTERECTOMY  2002  . left shoulder surgery  2009  . TUBAL LIGATION  1986    Family History  Problem Relation Age of Onset  . Hypertension Mother   . Diabetes Father   . Hypertension Father   . Stroke Father   . Anuerysm Sister   . Stroke Sister   . Breast cancer Paternal Grandmother   . Breast cancer Paternal Aunt     Social History Reviewed with no changes to be made today.   Outpatient Medications Prior to Visit  Medication Sig Dispense Refill  . aspirin 81 MG tablet Take 81 mg by mouth daily.    Marland Kitchen eletriptan (RELPAX) 40 MG tablet Take 1 tablet (40 mg total) by mouth as needed for migraine or headache. May repeat in 2 hours if headache persists or recurs. 12 tablet 6  . escitalopram (LEXAPRO) 10 MG tablet Take 1 tablet (10 mg total) by mouth daily. 90 tablet 1  . IRON PO Take by mouth.    . meclizine (ANTIVERT) 25 MG tablet Take 1 tablet (25 mg total) by mouth 3 (three) times daily as needed for dizziness. 15 tablet 0  . nortriptyline (PAMELOR) 10 MG capsule Take 30 mg by mouth at bedtime.    . pantoprazole (PROTONIX) 20 MG tablet Take 1 tablet (20 mg total) by mouth daily. 90 tablet 1  . rosuvastatin (CRESTOR) 5 MG tablet Take 1 tablet (5 mg total) by mouth daily. 90 tablet 1  . Vitamin D, Ergocalciferol, (DRISDOL) 1.25 MG (50000 UNIT) CAPS capsule Take 1 capsule (50,000 Units total) by mouth every 7 (seven)  days. 12 capsule 1  . memantine (NAMENDA) 10 MG tablet Take 10 mg by mouth 2 (two) times daily.     No facility-administered medications prior to visit.    Allergies  Allergen Reactions  . Benadryl [Diphenhydramine] Other (See Comments)    Agitation and hallucinations  . Codeine Nausea And Vomiting    No rash, swelling or other allergic sx  . Penicillins        Objective:    BP 115/81   Pulse 76   Temp 98.1 F (36.7 C) (Temporal)   Resp 17   Ht 5\' 4"  (1.626 m)   Wt 159 lb (72.1 kg)   SpO2 96%   BMI 27.29 kg/m  Wt Readings from Last 3 Encounters:   07/08/19 159 lb (72.1 kg)  05/17/19 166 lb (75.3 kg)  05/16/19 164 lb 0.4 oz (74.4 kg)    Physical Exam Constitutional:      Appearance: She is well-developed.  HENT:     Head: Normocephalic and atraumatic.     Right Ear: External ear normal.     Left Ear: External ear normal.     Nose: Nose normal.     Mouth/Throat:     Pharynx: No oropharyngeal exudate.  Eyes:     General: Lids are normal. No scleral icterus.       Right eye: No discharge.     Conjunctiva/sclera: Conjunctivae normal.     Pupils: Pupils are equal, round, and reactive to light.  Neck:     Thyroid: No thyromegaly.     Trachea: No tracheal deviation.  Cardiovascular:     Rate and Rhythm: Normal rate and regular rhythm.     Pulses:          Dorsalis pedis pulses are 1+ on the right side and 1+ on the left side.       Posterior tibial pulses are 1+ on the right side and 1+ on the left side.     Heart sounds: Normal heart sounds. No murmur. No friction rub.  Pulmonary:     Effort: Pulmonary effort is normal. No accessory muscle usage or respiratory distress.     Breath sounds: Normal breath sounds. No decreased breath sounds, wheezing, rhonchi or rales.  Chest:     Chest wall: No tenderness.     Breasts: Breasts are symmetrical.        Right: No inverted nipple, mass, nipple discharge, skin change or tenderness.        Left: No inverted nipple, mass, nipple discharge, skin change or tenderness.  Abdominal:     General: Bowel sounds are normal. There is no distension.     Palpations: Abdomen is soft. There is no mass.     Tenderness: There is no abdominal tenderness. There is no guarding or rebound.  Musculoskeletal:        General: No swelling, deformity or signs of injury. Normal range of motion.     Cervical back: Normal range of motion and neck supple.     Right lower leg: Tenderness (Does not allow me to passively flex or extend the right knee) present. No deformity or lacerations. No edema.     Left  lower leg: No edema.  Lymphadenopathy:     Cervical: No cervical adenopathy.  Skin:    General: Skin is warm and dry.     Findings: No erythema.  Neurological:     Mental Status: She is alert and oriented to person, place, and time.  Cranial Nerves: No cranial nerve deficit.     Sensory: Sensation is intact.     Motor: Motor function is intact.     Coordination: Coordination is intact. Coordination normal.     Gait: Gait is intact.     Deep Tendon Reflexes: Reflexes are normal and symmetric.     Reflex Scores:      Patellar reflexes are 2+ on the right side and 2+ on the left side. Psychiatric:        Speech: Speech normal.        Behavior: Behavior normal.        Thought Content: Thought content normal.        Judgment: Judgment normal.          Patient has been counseled extensively about nutrition and exercise as well as the importance of adherence with medications and regular follow-up. The patient was given clear instructions to go to ER or return to medical center if symptoms don't improve, worsen or new problems develop. The patient verbalized understanding.   Follow-up: Return if symptoms worsen or fail to improve.   Gildardo Pounds, FNP-BC [facility] [place] [place]   07/08/2019, 1:50 PM

## 2019-07-08 NOTE — Patient Instructions (Signed)

## 2019-07-24 MED FILL — ESCITALOPRAM 10 MG TABLET: 10 | 30 days supply | Qty: 30 | Fill #2

## 2019-07-29 ENCOUNTER — Other Ambulatory Visit: Payer: Self-pay | Admitting: Nurse Practitioner

## 2019-08-02 LAB — FECAL OCCULT BLOOD, IMMUNOCHEMICAL

## 2019-08-08 ENCOUNTER — Other Ambulatory Visit: Payer: Self-pay | Admitting: Nurse Practitioner

## 2019-08-08 MED FILL — ESCITALOPRAM 10 MG TABLET: 10 | 30 days supply | Qty: 30 | Fill #2

## 2019-08-08 MED FILL — ROSUVASTATIN CALCIUM 5 MG T: 5 | 30 days supply | Qty: 30 | Fill #2

## 2019-08-08 MED FILL — PANTOPRAZOLE SOD DR 20 MG T: 20 | 30 days supply | Qty: 30 | Fill #2

## 2019-08-24 ENCOUNTER — Other Ambulatory Visit: Payer: Self-pay | Admitting: Neurology

## 2019-08-26 MED FILL — NORTRIPTYLINE HCL 10 MG CAP: 10 | 30 days supply | Qty: 90 | Fill #0

## 2019-10-01 ENCOUNTER — Ambulatory Visit: Payer: Medicaid Other | Attending: Nurse Practitioner | Admitting: Nurse Practitioner

## 2019-10-01 ENCOUNTER — Other Ambulatory Visit: Payer: Self-pay

## 2019-10-01 ENCOUNTER — Other Ambulatory Visit: Payer: Self-pay | Admitting: Nurse Practitioner

## 2019-10-01 ENCOUNTER — Telehealth: Payer: Self-pay

## 2019-10-01 DIAGNOSIS — F419 Anxiety disorder, unspecified: Secondary | ICD-10-CM

## 2019-10-01 DIAGNOSIS — K219 Gastro-esophageal reflux disease without esophagitis: Secondary | ICD-10-CM

## 2019-10-01 MED ORDER — ESCITALOPRAM OXALATE 10 MG PO TABS: 10.0000 mg | ORAL_TABLET | Freq: Every day | ORAL | 1 refills | Status: DC

## 2019-10-01 MED ORDER — MECLIZINE HCL 25 MG PO TABS: 25.0000 mg | ORAL_TABLET | Freq: Three times a day (TID) | ORAL | 1 refills | Status: AC | PRN

## 2019-10-01 MED ORDER — PANTOPRAZOLE SODIUM 20 MG PO TBEC: 20.0000 mg | DELAYED_RELEASE_TABLET | Freq: Every day | ORAL | 1 refills | Status: DC

## 2019-10-01 MED ORDER — ROSUVASTATIN CALCIUM 5 MG PO TABS: 5.0000 mg | ORAL_TABLET | Freq: Every day | ORAL | 1 refills | Status: DC

## 2019-10-01 MED FILL — VIT D2 1.25 MG (50,000 UNIT: 1.25 MG | 84 days supply | Qty: 12 | Fill #1

## 2019-10-01 MED FILL — NORTRIPTYLINE HCL 10 MG CAP: 10 | 90 days supply | Qty: 270 | Fill #1

## 2019-10-01 MED FILL — ROSUVASTATIN CALCIUM 5 MG T: 5 | 90 days supply | Qty: 90 | Fill #0

## 2019-10-01 MED FILL — MECLIZINE 25 MG TABLET: 25 | 15 days supply | Qty: 45 | Fill #0

## 2019-10-01 MED FILL — ESCITALOPRAM 10 MG TABLET: 10 | 90 days supply | Qty: 90 | Fill #0

## 2019-10-01 MED FILL — PANTOPRAZOLE SOD DR 20 MG T: 20 | 90 days supply | Qty: 90 | Fill #0

## 2019-10-01 NOTE — Telephone Encounter (Signed)
Pt called requesting referral to NEUROLOGY. Pt states she does NOT want to continue with neurology at [facility]. Pt also request referral to ortho for arthritis. Pt also asking about scheduling shingles vacc & whooping cough vaccine.  Confirmed pt ph [phone].

## 2019-10-09 NOTE — Telephone Encounter (Signed)
Spoke to patient and informed we do not the whooping cough. Scheduled patient for a Shingrix vaccine on Monday 10/14/2019.  Pt would like PCP to refer her to a different neurologist for her migraine and orthopedic for arthritis.

## 2019-10-14 ENCOUNTER — Telehealth: Payer: Self-pay | Admitting: Nurse Practitioner

## 2019-10-14 ENCOUNTER — Ambulatory Visit: Payer: Medicaid Other | Admitting: Pharmacist

## 2019-10-14 NOTE — Telephone Encounter (Signed)
Spoke to patient. Pt. Stated she wants to be refer to gynecologist. No reason, just want to be seen by one per patient.

## 2019-10-14 NOTE — Telephone Encounter (Signed)
Called patient to reschedule her shingle vaccine and patient asked for her PCP to refer her out to a GYN. Please f/u

## 2019-10-15 ENCOUNTER — Other Ambulatory Visit: Payer: Self-pay | Admitting: Nurse Practitioner

## 2019-10-15 DIAGNOSIS — G43909 Migraine, unspecified, not intractable, without status migrainosus: Secondary | ICD-10-CM

## 2019-10-15 NOTE — Telephone Encounter (Signed)
Neurology referral sent. Is she requesting a referral to Ortho for right knee pain? If so she will need an xray first.

## 2019-10-15 NOTE — Telephone Encounter (Signed)
We have to have a diagnosis/reason for referral to GYN.

## 2019-10-16 ENCOUNTER — Encounter: Payer: Self-pay | Admitting: Neurology

## 2019-10-18 ENCOUNTER — Ambulatory Visit: Payer: Medicaid Other | Admitting: Pharmacist

## 2019-10-18 NOTE — Telephone Encounter (Signed)
Pt. Would like to get both of her knee xray for the Ortho referral.

## 2019-10-18 NOTE — Telephone Encounter (Signed)
She needs an office visit 

## 2019-10-18 NOTE — Telephone Encounter (Signed)
Pt. Stated she have a knot or a cyst remove in the middle of her breast.  Pt. Wants to get refer to get it lance or drain.

## 2019-10-31 NOTE — Telephone Encounter (Signed)
Attempt to reach patient to inform she need a OV / PCP.  No answer and LVM.

## 2019-11-04 ENCOUNTER — Telehealth: Payer: Self-pay | Admitting: Nurse Practitioner

## 2019-11-04 NOTE — Telephone Encounter (Signed)
Copied from CRM [id number]. Topic: General - Other >> Nov 04, 2019 12:20 PM Dalphine Handing A wrote: Patient stated she was returning [employer] call and would like a callback .

## 2019-11-04 NOTE — Telephone Encounter (Signed)
Spoke to patient and scheduled an OV for her knee pain.

## 2019-11-21 NOTE — Progress Notes (Deleted)
NEUROLOGY CONSULTATION NOTE  Connie Arroyo MRN: [id number] DOB: 1960-11-21  Referring provider: [name], NP Primary care provider: [name], NP  Reason for consult:  migarine  HISTORY OF PRESENT ILLNESS: Connie Arroyo is a 59 year old ***-handed female who presents for migraines.  History supplemented by referring provider's note.  ***.  In February, she ***.  CTA of head and neck on 05/16/2019 personally reviewed revealed no cerebral aneurysm or intracranial and extracranial large vessel occlusion, significant stenosis or dissection.  MRI of brain without contrast on 05/29/2019 personally reviewed showed mild chronic small vessel ischemic changes   Current NSAIDS:  ASA 81mg  daily Current analgesics:  *** Current triptans:  Relpax 40mg  Current ergotamine:  none Current anti-emetic:  none Current muscle relaxants:  none Current anti-anxiolytic:  none Current sleep aide:  none Current Antihypertensive medications:  none Current Antidepressant medications:  Nortriptyline 30mg  at bedtime, Lexapro 10mg  daily Current Anticonvulsant medications:  none Current anti-CGRP:  none Current Vitamins/Herbal/Supplements:  D, iron Current Antihistamines/Decongestants:  meclizine Other therapy:  *** Hormone/birth control:  None  Past NSAIDS:  Ibuprofen, naproxen Past analgesics:  Fioricet Past abortive triptans:  *** Past abortive ergotamine:  none Past muscle relaxants:  none Past anti-emetic:  none Past antihypertensive medications:  atenolol Past antidepressant medications:  *** Past anticonvulsant medications:  Depakote Past anti-CGRP:  none Past vitamins/Herbal/Supplements:  none Past antihistamines/decongestants:  none Other past therapies:  ***  Caffeine:  *** Alcohol:  *** Smoker:  *** Diet:  *** Exercise:  *** Depression:  ***; Anxiety:  *** Other pain:  *** Sleep hygiene:  *** Family history of headache:  ***   PAST MEDICAL HISTORY: Past Medical  History:  Diagnosis Date   Allergy    Anxiety    High cholesterol    History of stomach ulcers    Hypercholesteremia    Hypertension     PAST SURGICAL HISTORY: Past Surgical History:  Procedure Laterality Date   ABDOMINAL HYSTERECTOMY  2002   left shoulder surgery  2009   TUBAL LIGATION  1986    MEDICATIONS: Current Outpatient Medications on File Prior to Visit  Medication Sig Dispense Refill   aspirin 81 MG tablet Take 81 mg by mouth daily.     eletriptan (RELPAX) 40 MG tablet Take 1 tablet (40 mg total) by mouth as needed for migraine or headache. May repeat in 2 hours if headache persists or recurs. 12 tablet 6   escitalopram (LEXAPRO) 10 MG tablet Take 1 tablet (10 mg total) by mouth daily. 90 tablet 1   IRON PO Take by mouth.     meclizine (ANTIVERT) 25 MG tablet Take 1 tablet (25 mg total) by mouth 3 (three) times daily as needed for dizziness. 45 tablet 1   nortriptyline (PAMELOR) 10 MG capsule Take 30 mg by mouth at bedtime.     pantoprazole (PROTONIX) 20 MG tablet Take 1 tablet (20 mg total) by mouth daily. 90 tablet 1   rosuvastatin (CRESTOR) 5 MG tablet Take 1 tablet (5 mg total) by mouth daily. 90 tablet 1   Vitamin D, Ergocalciferol, (DRISDOL) 1.25 MG (50000 UNIT) CAPS capsule Take 1 capsule (50,000 Units total) by mouth every 7 (seven) days. 12 capsule 1   No current facility-administered medications on file prior to visit.    ALLERGIES: Allergies  Allergen Reactions   Benadryl [Diphenhydramine] Other (See Comments)    Agitation and hallucinations   Codeine Nausea And Vomiting    No rash, swelling or  other allergic sx   Penicillins     FAMILY HISTORY: Family History  Problem Relation Age of Onset   Hypertension Mother    Diabetes Father    Hypertension Father    Stroke Father    Anuerysm Sister    Stroke Sister    Breast cancer Paternal Grandmother    Breast cancer Paternal Aunt    SOCIAL HISTORY: Social History    Socioeconomic History   Marital status: Single    Spouse name: Not on file   Number of children: Not on file   Years of education: Not on file   Highest education level: Not on file  Occupational History   Not on file  Tobacco Use   Smoking status: Never Smoker   Smokeless tobacco: Never Used  Vaping Use   Vaping Use: Never used  Substance and Sexual Activity   Alcohol use: No   Drug use: No   Sexual activity: Not on file  Other Topics Concern   Not on file  Social History Narrative   Not on file   Social Determinants of Health   Financial Resource Strain:    Difficulty of Paying Living Expenses: Not on file  Food Insecurity:    Worried About Programme researcher, broadcasting/film/video in the Last Year: Not on file   [employer] of Food in the Last Year: Not on file  Transportation Needs:    Lack of Transportation (Medical): Not on file   Lack of Transportation (Non-Medical): Not on file  Physical Activity:    Days of Exercise per Week: Not on file   Minutes of Exercise per Session: Not on file  Stress:    Feeling of Stress : Not on file  Social Connections:    Frequency of Communication with Friends and Family: Not on file   Frequency of Social Gatherings with Friends and Family: Not on file   Attends Religious Services: Not on file   Active Member of Clubs or Organizations: Not on file   Attends Banker Meetings: Not on file   Marital Status: Not on file  Intimate Partner Violence:    Fear of Current or Ex-Partner: Not on file   Emotionally Abused: Not on file   Physically Abused: Not on file   Sexually Abused: Not on file    REVIEW OF SYSTEMS: Constitutional: No fevers, chills, or sweats, no generalized fatigue, change in appetite Eyes: No visual changes, double vision, eye pain Ear, nose and throat: No hearing loss, ear pain, nasal congestion, sore throat Cardiovascular: No chest pain, palpitations Respiratory:  No shortness of breath at  rest or with exertion, wheezes GastrointestinaI: No nausea, vomiting, diarrhea, abdominal pain, fecal incontinence Genitourinary:  No dysuria, urinary retention or frequency Musculoskeletal:  No neck pain, back pain Integumentary: No rash, pruritus, skin lesions Neurological: as above Psychiatric: No depression, insomnia, anxiety Endocrine: No palpitations, fatigue, diaphoresis, mood swings, change in appetite, change in weight, increased thirst Hematologic/Lymphatic:  No purpura, petechiae. Allergic/Immunologic: no itchy/runny eyes, nasal congestion, recent allergic reactions, rashes  PHYSICAL EXAM: *** General: No acute distress.  Patient appears well-groomed.  Head:  Normocephalic/atraumatic Eyes:  fundi examined but not visualized Neck: supple, no paraspinal tenderness, full range of motion Back: No paraspinal tenderness Heart: regular rate and rhythm Lungs: Clear to auscultation bilaterally. Vascular: No carotid bruits. Neurological Exam: Mental status: alert and oriented to person, place, and time, recent and remote memory intact, fund of knowledge intact, attention and concentration intact, speech fluent and  not dysarthric, language intact. Cranial nerves: CN I: not tested CN II: pupils equal, round and reactive to light, visual fields intact CN III, IV, VI:  full range of motion, no nystagmus, no ptosis CN V: facial sensation intact CN VII: upper and lower face symmetric CN VIII: hearing intact CN IX, X: gag intact, uvula midline CN XI: sternocleidomastoid and trapezius muscles intact CN XII: tongue midline Bulk & Tone: normal, no fasciculations. Motor:  5/5 throughout  Sensation:  Pinprick and vibration sensation intact. Deep Tendon Reflexes:  2+ throughout, toes downgoing.  Finger to nose testing:  Without dysmetria.  Heel to shin:  Without dysmetria.  Gait:  Normal station and stride.  Able to turn and tandem walk. Romberg  negative.  IMPRESSION: ***  PLAN: ***  Thank you for allowing me to take part in the care of this patient.  Shon Millet, DO  CC: ***

## 2019-11-25 ENCOUNTER — Ambulatory Visit: Payer: Medicare Other | Admitting: Neurology

## 2019-12-04 DIAGNOSIS — I10 Essential (primary) hypertension: Secondary | ICD-10-CM | POA: Diagnosis not present

## 2019-12-04 DIAGNOSIS — Z0184 Encounter for antibody response examination: Secondary | ICD-10-CM | POA: Diagnosis not present

## 2019-12-04 DIAGNOSIS — Z86018 Personal history of other benign neoplasm: Secondary | ICD-10-CM | POA: Diagnosis not present

## 2019-12-04 DIAGNOSIS — Z021 Encounter for pre-employment examination: Secondary | ICD-10-CM | POA: Diagnosis not present

## 2019-12-10 ENCOUNTER — Other Ambulatory Visit: Payer: Self-pay | Admitting: Nurse Practitioner

## 2019-12-10 ENCOUNTER — Other Ambulatory Visit: Payer: Self-pay

## 2019-12-10 ENCOUNTER — Encounter: Payer: Self-pay | Admitting: Nurse Practitioner

## 2019-12-10 ENCOUNTER — Ambulatory Visit: Payer: Medicare Other | Attending: Nurse Practitioner | Admitting: Nurse Practitioner

## 2019-12-10 VITALS — BP 114/78 | HR 89 | Temp 97.7°F | Ht 64.0 in | Wt 166.0 lb

## 2019-12-10 DIAGNOSIS — R7303 Prediabetes: Secondary | ICD-10-CM

## 2019-12-10 DIAGNOSIS — F419 Anxiety disorder, unspecified: Secondary | ICD-10-CM

## 2019-12-10 DIAGNOSIS — Z1211 Encounter for screening for malignant neoplasm of colon: Secondary | ICD-10-CM

## 2019-12-10 DIAGNOSIS — M25562 Pain in left knee: Secondary | ICD-10-CM | POA: Diagnosis not present

## 2019-12-10 DIAGNOSIS — M25561 Pain in right knee: Secondary | ICD-10-CM

## 2019-12-10 DIAGNOSIS — Z1159 Encounter for screening for other viral diseases: Secondary | ICD-10-CM | POA: Diagnosis not present

## 2019-12-10 DIAGNOSIS — G8929 Other chronic pain: Secondary | ICD-10-CM

## 2019-12-10 LAB — GLUCOSE, POCT (MANUAL RESULT ENTRY): POC Glucose: 146 mg/dl — AB (ref 70–99)

## 2019-12-10 MED ORDER — HYDROXYZINE HCL 25 MG PO TABS: 25.0000 mg | ORAL_TABLET | Freq: Three times a day (TID) | ORAL | 3 refills | Status: DC | PRN

## 2019-12-10 MED ORDER — DICLOFENAC SODIUM 1 % EX GEL: 2.0000 g | Freq: Four times a day (QID) | CUTANEOUS | 1 refills | Status: DC

## 2019-12-10 MED FILL — hydrOXYzine HCL 25 MG TABS: 25 | 10 days supply | Qty: 60 | Fill #0

## 2019-12-10 MED FILL — DICLOFENAC SODIUM 1% GEL: 1 | 25 days supply | Qty: 200 | Fill #0

## 2019-12-10 NOTE — Progress Notes (Signed)
Assessment & Plan:  Connie Arroyo was seen today for knee pain.  Diagnoses and all orders for this visit:  Chronic pain of both knees -     DG Knee Complete 4 Views Left; Future -     DG Knee Complete 4 Views Right; Future -     diclofenac Sodium (VOLTAREN) 1 % GEL; Apply 2 g topically 4 (four) times daily. Work on losing weight to help reduce joint pain. May alternate with heat and ice application for pain relief. May also alternate with acetaminophen as prescribed pain relief. Other alternatives include massage, acupuncture and water aerobics.  You must stay active and avoid a sedentary lifestyle.  Prediabetes -     Hemoglobin A1c -     Glucose (CBG) Continue blood sugar control as discussed in office today, low carbohydrate diet, and regular physical exercise as tolerated, 150 minutes per week (30 min each day, 5 days per week, or 50 min 3 days per week).    Need for hepatitis C screening test -     Hepatitis C Antibody  Colon cancer screening -     Ambulatory referral to Gastroenterology  Anxiety -     hydrOXYzine (ATARAX/VISTARIL) 25 MG tablet; Take 1-2 tablets (25-50 mg total) by mouth 3 (three) times daily as needed.    Patient has been counseled on age-appropriate routine health concerns for screening and prevention. These are reviewed and up-to-date. Referrals have been placed accordingly. Immunizations are up-to-date or declined.    Subjective:   Chief Complaint  Patient presents with   Knee Pain    Pt. stated she is having pain on both of her knee. She cannot get up off the chair w/o pain.   HPI Connie Arroyo 59 y.o. female presents to office today with complaints of bilateral knee pain.  Knee Pain Onset of the symptoms was over a year ago. Inciting event: none known. Current symptoms include crepitus sensation, giving out, locking and stiffness. Pain is aggravated by going up and down stairs, kneeling, squatting, standing and walking.  Patient has had no prior  knee problems. Evaluation to date: noneTreatment to date: TYLENOL with little relief of symptoms.  Anxiety  Notes increased anxiety despite taking lexapro 10 mg daily. Lots of family stressors with sister and mother recently.    Review of Systems  Constitutional: Negative for fever, malaise/fatigue and weight loss.  HENT: Negative.  Negative for nosebleeds.   Eyes: Negative.  Negative for blurred vision, double vision and photophobia.  Respiratory: Negative.  Negative for cough and shortness of breath.   Cardiovascular: Negative.  Negative for chest pain, palpitations and leg swelling.  Gastrointestinal: Negative.  Negative for heartburn, nausea and vomiting.  Musculoskeletal: Positive for joint pain. Negative for myalgias.  Neurological: Negative.  Negative for dizziness, focal weakness, seizures and headaches.  Psychiatric/Behavioral: Positive for depression. Negative for suicidal ideas. The patient is nervous/anxious.     Past Medical History:  Diagnosis Date   Allergy    Anxiety    High cholesterol    History of stomach ulcers    Hypercholesteremia    Hypertension     Past Surgical History:  Procedure Laterality Date   ABDOMINAL HYSTERECTOMY  2002   left shoulder surgery  2009   TUBAL LIGATION  1986    Family History  Problem Relation Age of Onset   Hypertension Mother    Diabetes Father    Hypertension Father    Stroke Father    Anuerysm Sister  Stroke Sister    Breast cancer Paternal Grandmother    Breast cancer Paternal Aunt     Social History Reviewed with no changes to be made today.   Outpatient Medications Prior to Visit  Medication Sig Dispense Refill   aspirin 81 MG tablet Take 81 mg by mouth daily.     eletriptan (RELPAX) 40 MG tablet Take 1 tablet (40 mg total) by mouth as needed for migraine or headache. May repeat in 2 hours if headache persists or recurs. 12 tablet 6   escitalopram (LEXAPRO) 10 MG tablet Take 1 tablet (10 mg  total) by mouth daily. 90 tablet 1   IRON PO Take by mouth.     meclizine (ANTIVERT) 25 MG tablet Take 1 tablet (25 mg total) by mouth 3 (three) times daily as needed for dizziness. 45 tablet 1   nortriptyline (PAMELOR) 10 MG capsule Take 30 mg by mouth at bedtime.     pantoprazole (PROTONIX) 20 MG tablet Take 1 tablet (20 mg total) by mouth daily. 90 tablet 1   rosuvastatin (CRESTOR) 5 MG tablet Take 1 tablet (5 mg total) by mouth daily. 90 tablet 1   Vitamin D, Ergocalciferol, (DRISDOL) 1.25 MG (50000 UNIT) CAPS capsule Take 1 capsule (50,000 Units total) by mouth every 7 (seven) days. 12 capsule 1   No facility-administered medications prior to visit.    Allergies  Allergen Reactions   Benadryl [Diphenhydramine] Other (See Comments)    Agitation and hallucinations   Codeine Nausea And Vomiting    No rash, swelling or other allergic sx   Penicillins        Objective:    BP 114/78 (BP Location: Left Arm, Patient Position: Sitting, Cuff Size: Normal)    Pulse 89    Temp 97.7 F (36.5 C) (Temporal)    Ht 5\' 4"  (1.626 m)    Wt 166 lb (75.3 kg)    SpO2 99%    BMI 28.49 kg/m  Wt Readings from Last 3 Encounters:  12/10/19 166 lb (75.3 kg)  07/08/19 159 lb (72.1 kg)  05/17/19 166 lb (75.3 kg)    Physical Exam Vitals and nursing note reviewed.  Constitutional:      Appearance: She is well-developed.  HENT:     Head: Normocephalic and atraumatic.  Cardiovascular:     Rate and Rhythm: Normal rate and regular rhythm.     Heart sounds: Normal heart sounds. No murmur heard.  No friction rub. No gallop.   Pulmonary:     Effort: Pulmonary effort is normal. No tachypnea or respiratory distress.     Breath sounds: Normal breath sounds. No decreased breath sounds, wheezing, rhonchi or rales.  Chest:     Chest wall: No tenderness.  Abdominal:     General: Bowel sounds are normal.     Palpations: Abdomen is soft.  Musculoskeletal:        General: Normal range of motion.      Cervical back: Normal range of motion.  Skin:    General: Skin is warm and dry.  Neurological:     Mental Status: She is alert and oriented to person, place, and time.     Coordination: Coordination normal.  Psychiatric:        Behavior: Behavior normal. Behavior is cooperative.        Thought Content: Thought content normal.        Judgment: Judgment normal.          Patient has been counseled extensively  about nutrition and exercise as well as the importance of adherence with medications and regular follow-up. The patient was given clear instructions to go to ER or return to medical center if symptoms don't improve, worsen or new problems develop. The patient verbalized understanding.   Follow-up: Return in about 3 months (around 03/11/2020).   Claiborne Rigg, FNP-BC [facility] [place], [place] [phone]   12/10/2019, 4:27 PM

## 2019-12-11 LAB — HEMOGLOBIN A1C
Est. average glucose Bld gHb Est-mCnc: 123 mg/dL
Hgb A1c MFr Bld: 5.9 % — ABNORMAL HIGH (ref 4.8–5.6)

## 2019-12-11 LAB — HEPATITIS C ANTIBODY: Hep C Virus Ab: <0.1 s/co ratio (ref 0.0–0.9)

## 2019-12-12 ENCOUNTER — Encounter: Payer: Self-pay | Admitting: Gastroenterology

## 2019-12-12 ENCOUNTER — Other Ambulatory Visit: Payer: Self-pay

## 2019-12-12 ENCOUNTER — Ambulatory Visit (HOSPITAL_COMMUNITY)
Admission: RE | Admit: 2019-12-12 | Discharge: 2019-12-12 | Disposition: A | Discharge status: Home / Self Care | Payer: Medicare Other | Source: Ambulatory Visit | Attending: Nurse Practitioner | Admitting: Nurse Practitioner

## 2019-12-12 DIAGNOSIS — M25561 Pain in right knee: Secondary | ICD-10-CM | POA: Insufficient documentation

## 2019-12-12 DIAGNOSIS — G8929 Other chronic pain: Secondary | ICD-10-CM

## 2019-12-12 DIAGNOSIS — M25562 Pain in left knee: Secondary | ICD-10-CM | POA: Diagnosis not present

## 2019-12-17 ENCOUNTER — Telehealth: Payer: Self-pay

## 2019-12-17 NOTE — Telephone Encounter (Signed)
Copied from CRM [id number]. Topic: General - Other >> Dec 17, 2019  4:42 PM Marylen Ponto wrote: Reason for CRM: Pt called for an update on her most recent lab results. Pt requests that the nurse call her back before leaving the office today. Cb# [phone]

## 2019-12-18 NOTE — Telephone Encounter (Signed)
Refer to today notes in Epic

## 2020-01-06 NOTE — Progress Notes (Signed)
NEUROLOGY CONSULTATION NOTE  Connie Arroyo MRN: [id number] DOB: 1960/09/18  Referring provider: [name], NP Primary care provider: [name], NP  Reason for consult:  migraines  HISTORY OF PRESENT ILLNESS: Connie Arroyo is a 59 year old right-handed female with chronic pain syndrome who presents for migraines.  History supplemented by prior neurologist's notes.  She has had migraines since her 21s.  It is a severe pounding headache either right temple or left occiput.  She has associated vertigo, nausea, photophobia, phonophobia, but no visual disturbance, vomiting, speech disturbance, numbness and weakness.  They typically last 2 to 7 days.  Headaches are almost daily.  She reports significant stress caring for her teenage granddaughter.    She has had episodes of syncope.  She had an MRI of brain without contrast performed on 01/25/2017 to evaluate syncopal spell associated with migraine, which showed "two punctate signal change foci of the subcortical white matter."  Family history of aneurysm.  CTA of head and neck from 05/16/2019 personally reviewed was normal.  MRI of brain on 05/29/2019 personally reviewed and again showed few scattered T2 foci in the cerebral white matter but no acute findings.    Current NSAIDS/analgesics:  ASA 81mg  daily Current triptans:  none Current ergotamine:  none Current anti-emetic:  none Current muscle relaxants:  none Current Antihypertensive medications:  none Current Antidepressant medications:  She is supposed to be taking nortriptyline 30mg  at bedtime but only has been taking 10mg  twice daily, Lexapro 10mg  Current Anticonvulsant medications:  none Current anti-CGRP:  none Current Vitamins/Herbal/Supplements:  D Current Antihistamines/Decongestants:  none Other therapy:  none Hormone/birth control:  None Other medications:  Hydroxyzine  Past NSAIDS/analgesics:  Ibuprofen, naproxen, Fioricet Past abortive triptans:  Relpax  40mg  (helpful but not covered) Past abortive ergotamine:  none Past muscle relaxants:  none Past anti-emetic:  none Past antihypertensive medications:  atenolol Past antidepressant medications:  none Past anticonvulsant medications:  Topiramate, Depakote Past anti-CGRP:  none Past vitamins/Herbal/Supplements:  none Past antihistamines/decongestants:  none Other past therapies:  Namenda, occipital nerve block (increased headache)  Drinks tea, ginger ale.  1 L water daily.  Rarely Pepsi.  No coffee.   Skips breakfast, sometimes skips lunch. Sleep is okay.  Family history:  Sister (migraines, aneurysm ruptured), mother (aneurysm), uncle (aneurysm).  She was checked for aneurysm and was negative.  CBC and CMP from March were normal.   PAST MEDICAL HISTORY: Past Medical History:  Diagnosis Date  . Allergy   . Anxiety   . High cholesterol   . History of stomach ulcers   . Hypercholesteremia   . Hypertension     PAST SURGICAL HISTORY: Past Surgical History:  Procedure Laterality Date  . ABDOMINAL HYSTERECTOMY  2002  . left shoulder surgery  2009  . TUBAL LIGATION  1986    MEDICATIONS: Current Outpatient Medications on File Prior to Visit  Medication Sig Dispense Refill  . aspirin 81 MG tablet Take 81 mg by mouth daily.    . diclofenac Sodium (VOLTAREN) 1 % GEL Apply 2 g topically 4 (four) times daily. 200 g 1  . eletriptan (RELPAX) 40 MG tablet Take 1 tablet (40 mg total) by mouth as needed for migraine or headache. May repeat in 2 hours if headache persists or recurs. 12 tablet 6  . escitalopram (LEXAPRO) 10 MG tablet Take 1 tablet (10 mg total) by mouth daily. 90 tablet 1  . hydrOXYzine (ATARAX/VISTARIL) 25 MG tablet Take 1-2 tablets (25-50 mg total) by  mouth 3 (three) times daily as needed. 60 tablet 3  . IRON PO Take by mouth.    . nortriptyline (PAMELOR) 10 MG capsule Take 30 mg by mouth at bedtime.    . pantoprazole (PROTONIX) 20 MG tablet Take 1 tablet (20 mg total)  by mouth daily. 90 tablet 1  . rosuvastatin (CRESTOR) 5 MG tablet Take 1 tablet (5 mg total) by mouth daily. 90 tablet 1  . Vitamin D, Ergocalciferol, (DRISDOL) 1.25 MG (50000 UNIT) CAPS capsule Take 1 capsule (50,000 Units total) by mouth every 7 (seven) days. 12 capsule 1   No current facility-administered medications on file prior to visit.    ALLERGIES: Allergies  Allergen Reactions  . Benadryl [Diphenhydramine] Other (See Comments)    Agitation and hallucinations  . Codeine Nausea And Vomiting    No rash, swelling or other allergic sx  . Penicillins     FAMILY HISTORY: Family History  Problem Relation Age of Onset  . Hypertension Mother   . Diabetes Father   . Hypertension Father   . Stroke Father   . Anuerysm Sister   . Stroke Sister   . Breast cancer Paternal Grandmother   . Breast cancer Paternal Aunt     SOCIAL HISTORY: Social History   Socioeconomic History  . Marital status: Single    Spouse name: Not on file  . Number of children: Not on file  . Years of education: Not on file  . Highest education level: Not on file  Occupational History  . Not on file  Tobacco Use  . Smoking status: Never Smoker  . Smokeless tobacco: Never Used  Vaping Use  . Vaping Use: Never used  Substance and Sexual Activity  . Alcohol use: No  . Drug use: No  . Sexual activity: Not on file  Other Topics Concern  . Not on file  Social History Narrative  . Not on file   Social Determinants of Health   Financial Resource Strain:   . Difficulty of Paying Living Expenses: Not on file  Food Insecurity:   . Worried About Programme researcher, broadcasting/film/video in the Last Year: Not on file  . Ran Out of Food in the Last Year: Not on file  Transportation Needs:   . Lack of Transportation (Medical): Not on file  . Lack of Transportation (Non-Medical): Not on file  Physical Activity:   . Days of Exercise per Week: Not on file  . Minutes of Exercise per Session: Not on file  Stress:   . Feeling  of Stress : Not on file  Social Connections:   . Frequency of Communication with Friends and Family: Not on file  . Frequency of Social Gatherings with Friends and Family: Not on file  . Attends Religious Services: Not on file  . Active Member of Clubs or Organizations: Not on file  . Attends Banker Meetings: Not on file  . Marital Status: Not on file  Intimate Partner Violence:   . Fear of Current or Ex-Partner: Not on file  . Emotionally Abused: Not on file  . Physically Abused: Not on file  . Sexually Abused: Not on file   PHYSICAL EXAM: Blood pressure 137/84, pulse 78, resp. rate 18, height 5\' 4"  (1.626 m), weight 163 lb (73.9 kg), SpO2 98 %. General: No acute distress.  Patient appears well-groomed.   Head:  Normocephalic/atraumatic Eyes:  fundi examined but not visualized Neck: supple, no paraspinal tenderness, full range of motion Back: No  paraspinal tenderness Heart: regular rate and rhythm Lungs: Clear to auscultation bilaterally. Vascular: No carotid bruits. Neurological Exam: Mental status: alert and oriented to person, place, and time, recent and remote memory intact, fund of knowledge intact, attention and concentration intact, speech fluent and not dysarthric, language intact. Cranial nerves: CN I: not tested CN II: pupils equal, round and reactive to light, visual fields intact CN III, IV, VI:  full range of motion, no nystagmus, no ptosis CN V: facial sensation intact CN VII: upper and lower face symmetric CN VIII: hearing intact CN IX, X: gag intact, uvula midline CN XI: sternocleidomastoid and trapezius muscles intact CN XII: tongue midline Bulk & Tone: normal, no fasciculations. Motor:  5/5 throughout  Sensation:  temperature and vibration sensation intact.  Deep Tendon Reflexes:  2+ throughout, toes downgoing.  Finger to nose testing:  Without dysmetria.   Heel to shin:  Without dysmetria.   Gait:  Normal station and stride.  Romberg  negative.  IMPRESSION: Chronic daily headache.  Aggravated by underlying emotional stress.  Possibly migraine as responds to eletriptan.  PLAN: 1. Advised to take nortriptyline 30mg  at bedtime.  If no improvement in 6 weeks, she is to contact me and we will increase dose to 50mg  at bedtime. 2.  For rescue, will prescribe rizatriptan 10mg  (as eletriptan was effective) 3.  Zofran 4mg  for nausea 4.  Limit use of pain relievers to no more than 2 days out of week to prevent risk of rebound or medication-overuse headache. 5.  Keep headache diary 6.  Follow up 6 months.  Thank you for allowing me to take part in the care of this patient.  , DO  CC: , NP

## 2020-01-07 ENCOUNTER — Ambulatory Visit (INDEPENDENT_AMBULATORY_CARE_PROVIDER_SITE_OTHER): Payer: Medicare Other | Admitting: Neurology

## 2020-01-07 ENCOUNTER — Other Ambulatory Visit: Payer: Self-pay

## 2020-01-07 ENCOUNTER — Other Ambulatory Visit: Payer: Self-pay | Admitting: Neurology

## 2020-01-07 ENCOUNTER — Encounter: Payer: Self-pay | Admitting: Neurology

## 2020-01-07 VITALS — BP 137/84 | HR 78 | Resp 18 | Ht 64.0 in | Wt 163.0 lb

## 2020-01-07 DIAGNOSIS — R519 Headache, unspecified: Secondary | ICD-10-CM

## 2020-01-07 DIAGNOSIS — G43009 Migraine without aura, not intractable, without status migrainosus: Secondary | ICD-10-CM

## 2020-01-07 MED ORDER — ONDANSETRON 4 MG PO TBDP: 4.0000 mg | ORAL_TABLET | Freq: Three times a day (TID) | ORAL | 5 refills | Status: DC | PRN

## 2020-01-07 MED ORDER — RIZATRIPTAN BENZOATE 10 MG PO TABS: ORAL_TABLET | ORAL | 5 refills | Status: DC

## 2020-01-07 MED FILL — ONDANSETRON ODT 4 MG TABLET: 4 | 6 days supply | Qty: 20 | Fill #0

## 2020-01-07 MED FILL — RIZATRIPTAN BENZOATE 10 MG: 10 | 30 days supply | Qty: 9 | Fill #0

## 2020-01-07 NOTE — Patient Instructions (Signed)
  1. Start taking nortriptyline three capsules at bedtime. If no improvement in headaches in 6 weeks, contact me for further instruction 2. Take rizatriptan 10mg  at earliest onset of headache.  May repeat dose once in 2 hours if needed.  Maximum 2 tablets in 24 hours. 3. Take ondansetron 4mg  for nausea 4. Limit use of pain relievers to no more than 2 days out of the week.  These medications include acetaminophen, NSAIDs (ibuprofen/Advil/Motrin, naproxen/Aleve, triptans (Imitrex/sumatriptan), Excedrin, and narcotics.  This will help reduce risk of rebound headaches. 5. Be aware of common food triggers:  - Caffeine:  coffee, black tea, cola, Mt. Dew  - Chocolate  - Dairy:  aged cheeses (brie, blue, cheddar, gouda, [place], provolone, [place], Swiss, etc), chocolate milk, buttermilk, sour cream, limit eggs and yogurt  - Nuts, peanut butter  - Alcohol  - Cereals/grains:  FRESH breads (fresh bagels, sourdough, doughnuts), yeast productions  - Processed/canned/aged/cured meats (pre-packaged deli meats, hotdogs)  - MSG/glutamate:  soy sauce, flavor enhancer, pickled/preserved/marinated foods  - Sweeteners:  aspartame (Equal, Nutrasweet).  Sugar and Splenda are okay  - Vegetables:  legumes (lima beans, lentils, snow peas, fava beans, pinto peans, peas, garbanzo beans), sauerkraut, onions, olives, pickles  - Fruit:  avocados, bananas, citrus fruit (orange, lemon, grapefruit), mango  - Other:  Frozen meals, macaroni and cheese 6. Routine exercise 7. Stay adequately hydrated (aim for 64 oz water daily) 8. Keep headache diary 9. Maintain proper stress management 10. Maintain proper sleep hygiene 11. Do not skip meals 12. Consider supplements:  magnesium citrate 400mg  daily, riboflavin 400mg  daily, coenzyme Q10 100mg  three times daily.

## 2020-01-13 ENCOUNTER — Telehealth: Payer: Self-pay | Admitting: Neurology

## 2020-01-13 NOTE — Telephone Encounter (Signed)
Called patient and she informed me that she already spoke to someone else who is taking care of this for her.

## 2020-01-13 NOTE — Telephone Encounter (Signed)
Patient left message with after hours stating she was seen for headaches and vertigo. She would like a handicap sticker.

## 2020-01-14 ENCOUNTER — Telehealth: Payer: Self-pay | Admitting: Neurology

## 2020-01-14 ENCOUNTER — Telehealth: Payer: Self-pay | Admitting: *Deleted

## 2020-01-14 NOTE — Telephone Encounter (Signed)
Dr.Gupta,  This patient is scheduled for a direct screening colonoscopy with you on 02/14/20. Per care everywhere she had a colonoscopy at [facility] on 05/06/2013. [facility] only seen and prep was "fair" so the recall was 5 years. Ok to proceed with colonoscopy as scheduled with a 2 day prep? Please advise. Thank you, Breon Rehm pv

## 2020-01-14 NOTE — Telephone Encounter (Signed)
Per pt she was confused of who she was speaking to on 01/13/20, But she do need the handicap sticker.   Pt advised per DR. Jaffe he do not give handicap stickers for Headaches.

## 2020-01-14 NOTE — Telephone Encounter (Signed)
Patient was seen for headaches and vertigo. She would like a handicap sticker. Previous phone note closed in error.

## 2020-01-15 NOTE — Telephone Encounter (Signed)
OK to proceed with a 2-day prep. RG

## 2020-01-15 NOTE — Telephone Encounter (Signed)
Noted  

## 2020-01-21 DIAGNOSIS — Z1159 Encounter for screening for other viral diseases: Secondary | ICD-10-CM | POA: Diagnosis not present

## 2020-01-21 DIAGNOSIS — R059 Cough, unspecified: Secondary | ICD-10-CM | POA: Diagnosis not present

## 2020-01-21 DIAGNOSIS — J209 Acute bronchitis, unspecified: Secondary | ICD-10-CM | POA: Diagnosis not present

## 2020-01-27 ENCOUNTER — Ambulatory Visit (AMBULATORY_SURGERY_CENTER): Payer: Medicare Other | Admitting: *Deleted

## 2020-01-27 ENCOUNTER — Other Ambulatory Visit: Payer: Self-pay | Admitting: Gastroenterology

## 2020-01-27 ENCOUNTER — Other Ambulatory Visit: Payer: Self-pay

## 2020-01-27 VITALS — Ht 64.0 in | Wt 164.0 lb

## 2020-01-27 DIAGNOSIS — Z1211 Encounter for screening for malignant neoplasm of colon: Secondary | ICD-10-CM

## 2020-01-27 MED ORDER — BISACODYL EC 5 MG PO TBEC: 5.0000 mg | DELAYED_RELEASE_TABLET | Freq: Once | ORAL | 0 refills | Status: AC

## 2020-01-27 MED ORDER — POLYETHYLENE GLYCOL 3350 17 GM/SCOOP PO POWD: 0.5000 | Freq: Once | ORAL | 0 refills | Status: AC

## 2020-01-27 MED ORDER — NA SULFATE-K SULFATE-MG SULF 17.5-3.13-1.6 GM/177ML PO SOLN: ORAL | 0 refills | Status: DC

## 2020-01-27 NOTE — Progress Notes (Signed)
Patient's pre-visit was done today over the phone with the patient due to COVID-19 pandemic. Name,DOB and address verified. Insurance verified. Packet of Prep instructions mailed to patient including copy of a consent form and pre-procedure patient acknowledgement form-pt is aware.  Patient understands to call us back with any questions or concerns. COVID-19 vaccines completed 12/31/2019 booster per pt.  Pt is aware that care partner will wait in the car during procedure; if they feel like they will be too hot or cold to wait in the car; they may wait in the 4 th floor lobby. Patient is aware to bring only one care partner. We want them to wear a mask (we do not have any that we can provide them), practice social distancing, and we will check their temperatures when they get here.  I did remind the patient that their care partner needs to stay in the parking lot the entire time and have a cell phone available, we will call them when the pt is ready for discharge. Patient will wear mask into building.

## 2020-01-28 MED FILL — SUPREP BOWEL PREP KIT: 17.5-3.13-1 | 1 days supply | Qty: 354 | Fill #0

## 2020-02-11 ENCOUNTER — Telehealth: Payer: Self-pay | Admitting: Neurology

## 2020-02-11 NOTE — Telephone Encounter (Signed)
Telephone call to pt, Pt states for the last 4-5 days now she had some shaking in her sleep. Per pt there is no one to let her know that she is shaking but she has noticed it.    Please advise.

## 2020-02-11 NOTE — Telephone Encounter (Signed)
How does she know she is shaking in her sleep if she is asleep and nobody is there to witness it?  It doesn't make sense to me

## 2020-02-12 NOTE — Telephone Encounter (Signed)
Tried calling pt, No answer. LMOVM to call us back.

## 2020-02-14 ENCOUNTER — Telehealth: Payer: Self-pay | Admitting: Gastroenterology

## 2020-02-14 ENCOUNTER — Encounter: Payer: Medicare Other | Admitting: Gastroenterology

## 2020-02-14 NOTE — Telephone Encounter (Signed)
Good morning Dr. Chales Abrahams, patient just called to cancel her procedure for today.  States has been vomiting all night.  Will call back to reschedule.

## 2020-02-14 NOTE — Telephone Encounter (Signed)
Thanks for letting me know RG 

## 2020-02-24 MED FILL — ROSUVASTATIN CALCIUM 5 MG T: 5 | 90 days supply | Qty: 90 | Fill #1

## 2020-02-24 MED FILL — ESCITALOPRAM 10 MG TABLET: 10 | 90 days supply | Qty: 90 | Fill #1

## 2020-02-24 MED FILL — NORTRIPTYLINE HCL 10 MG CAP: 10 | 90 days supply | Qty: 270 | Fill #2

## 2020-02-24 MED FILL — PANTOPRAZOLE SOD DR 20 MG T: 20 | 90 days supply | Qty: 90 | Fill #1

## 2020-02-24 NOTE — Telephone Encounter (Signed)
Please close, thanks

## 2020-02-24 NOTE — Telephone Encounter (Signed)
Has this been completed?  Sending to clinical staff for review: Okay to sign/close encounter or is further follow up needed? 

## 2020-03-17 ENCOUNTER — Other Ambulatory Visit: Payer: Self-pay

## 2020-03-17 ENCOUNTER — Other Ambulatory Visit: Payer: Self-pay | Admitting: Nurse Practitioner

## 2020-03-17 ENCOUNTER — Ambulatory Visit: Payer: Medicare Other | Attending: Nurse Practitioner | Admitting: Nurse Practitioner

## 2020-03-17 ENCOUNTER — Encounter: Payer: Self-pay | Admitting: Nurse Practitioner

## 2020-03-17 DIAGNOSIS — R7989 Other specified abnormal findings of blood chemistry: Secondary | ICD-10-CM

## 2020-03-17 DIAGNOSIS — R7303 Prediabetes: Secondary | ICD-10-CM | POA: Diagnosis not present

## 2020-03-17 DIAGNOSIS — M2550 Pain in unspecified joint: Secondary | ICD-10-CM | POA: Diagnosis not present

## 2020-03-17 DIAGNOSIS — E785 Hyperlipidemia, unspecified: Secondary | ICD-10-CM | POA: Diagnosis not present

## 2020-03-17 DIAGNOSIS — K219 Gastro-esophageal reflux disease without esophagitis: Secondary | ICD-10-CM | POA: Diagnosis not present

## 2020-03-17 DIAGNOSIS — F419 Anxiety disorder, unspecified: Secondary | ICD-10-CM

## 2020-03-17 MED ORDER — ROSUVASTATIN CALCIUM 5 MG PO TABS: 5.0000 mg | ORAL_TABLET | Freq: Every day | ORAL | 1 refills | Status: DC

## 2020-03-17 MED ORDER — GABAPENTIN 300 MG PO CAPS: 300.0000 mg | ORAL_CAPSULE | Freq: Every day | ORAL | 0 refills | Status: DC

## 2020-03-17 MED ORDER — PANTOPRAZOLE SODIUM 20 MG PO TBEC: 20.0000 mg | DELAYED_RELEASE_TABLET | Freq: Every day | ORAL | 1 refills | Status: DC

## 2020-03-17 MED ORDER — ESCITALOPRAM OXALATE 10 MG PO TABS: 10.0000 mg | ORAL_TABLET | Freq: Every day | ORAL | 1 refills | Status: DC

## 2020-03-17 NOTE — Progress Notes (Signed)
Virtual Visit via Telephone Note Due to national recommendations of social distancing due to [place] 19, telehealth visit is felt to be most appropriate for this patient at this time.  I discussed the limitations, risks, security and privacy concerns of performing an evaluation and management service by telephone and the availability of in person appointments. I also discussed with the patient that there may be a patient responsible charge related to this service. The patient expressed understanding and agreed to proceed.    I connected with Connie Arroyo on 03/17/20  at   3:30 PM EST  EDT by telephone and verified that I am speaking with the correct person using two identifiers.   Consent I discussed the limitations, risks, security and privacy concerns of performing an evaluation and management service by telephone and the availability of in person appointments. I also discussed with the patient that there may be a patient responsible charge related to this service. The patient expressed understanding and agreed to proceed.   Location of Patient: Private Residence    Location of Provider: [place] [facility] [employer] Office    Persons participating in Telemedicine visit: Connie Rankins FNP-BC [place]    History of Present Illness: Telemedicine visit for: Follow Up  has a past medical history of Allergy, Anemia, Anxiety, High cholesterol, History of stomach ulcers, Hypercholesteremia, Hypertension, Migraine headache, and Red blood cell abnormality.  Doing well today. Denies chest pain, shortness of breath, palpitations, lightheadedness, dizziness, headaches or BLE edema.   Endorses low back pain worse at night and with change of temperature. Denies any injury or trauma. Denies symptoms of sciatica or any involuntary loss of stool or urine.   Past Medical History:  Diagnosis Date  . Allergy   . Anemia   . Anxiety   . High cholesterol   . History of  stomach ulcers   . Hypercholesteremia   . Hypertension   . Migraine headache   . Red blood cell abnormality    "small red blood cells" per pt    Past Surgical History:  Procedure Laterality Date  . ABDOMINAL HYSTERECTOMY  2002  . COLONOSCOPY  05/06/2013   [facility] = prep fair  . left shoulder surgery  2009  . TUBAL LIGATION  1986    Family History  Problem Relation Age of Onset  . Hypertension Mother   . Colon polyps Mother   . Diabetes Father   . Hypertension Father   . Stroke Father   . Anuerysm Sister   . Stroke Sister   . Breast cancer Paternal Grandmother   . Breast cancer Paternal Aunt   . Colon cancer Neg Hx   . Esophageal cancer Neg Hx   . Rectal cancer Neg Hx   . Stomach cancer Neg Hx     Social History   Socioeconomic History  . Marital status: Single    Spouse name: Not on file  . Number of children: Not on file  . Years of education: Not on file  . Highest education level: Not on file  Occupational History  . Not on file  Tobacco Use  . Smoking status: Never Smoker  . Smokeless tobacco: Never Used  Vaping Use  . Vaping Use: Never used  Substance and Sexual Activity  . Alcohol use: No  . Drug use: No  . Sexual activity: Not on file  Other Topics Concern  . Not on file  Social History Narrative   Right handed  Drinks caffeine   One story home   Social Determinants of Radio broadcast assistant Strain: Not on file  Food Insecurity: Not on file  Transportation Needs: Not on file  Physical Activity: Not on file  Stress: Not on file  Social Connections: Not on file     Observations/Objective: Awake, alert and oriented x 3   Review of Systems  Constitutional: Negative for fever, malaise/fatigue and weight loss.  HENT: Negative.  Negative for nosebleeds.   Eyes: Negative.  Negative for blurred vision, double vision and photophobia.  Respiratory: Negative.  Negative for cough and shortness of breath.   Cardiovascular: Negative.   Negative for chest pain, palpitations and leg swelling.  Gastrointestinal: Positive for heartburn. Negative for nausea and vomiting.  Musculoskeletal: Positive for back pain and joint pain. Negative for myalgias.  Neurological: Negative.  Negative for dizziness, focal weakness, seizures and headaches.  Psychiatric/Behavioral: Negative for suicidal ideas. The patient is nervous/anxious.     Assessment and Plan: [name] was seen today for follow-up.  Diagnoses and all orders for this visit:  Arthralgia of multiple joints -     gabapentin (NEURONTIN) 300 MG capsule; Take 1 capsule (300 mg total) by mouth at bedtime. Recommended heat application  GERD without esophagitis -     pantoprazole (PROTONIX) 20 MG tablet; Take 1 tablet (20 mg total) by mouth daily. INSTRUCTIONS: Avoid GERD Triggers: acidic, spicy or fried foods, caffeine, coffee, sodas,  alcohol and chocolate.   Anxiety -     escitalopram (LEXAPRO) 10 MG tablet; Take 1 tablet (10 mg total) by mouth daily.  Prediabetes -     CMP14+EGFR; Future  Abnormal CBC -     CBC; Future  Dyslipidemia, goal LDL below 70 -     rosuvastatin (CRESTOR) 5 MG tablet; Take 1 tablet (5 mg total) by mouth daily. INSTRUCTIONS: Work on a low fat, heart healthy diet and participate in regular aerobic exercise program by working out at least 150 minutes per week; 5 days a week-30 minutes per day. Avoid red meat/beef/steak,  fried foods. junk foods, sodas, sugary drinks, unhealthy snacking, alcohol and smoking.  Drink at least 80 oz of water per day and monitor your carbohydrate intake daily.       Follow Up Instructions Return in about 3 months (around 06/15/2020).     I discussed the assessment and treatment plan with the patient. The patient was provided an opportunity to ask questions and all were answered. The patient agreed with the plan and demonstrated an understanding of the instructions.   The patient was advised to call back or seek an  in-person evaluation if the symptoms worsen or if the condition fails to improve as anticipated.  I provided 17 minutes of non-face-to-face time during this encounter including median intraservice time, reviewing previous notes, labs, imaging, medications and explaining diagnosis and management.  Gildardo Pounds, FNP-BC

## 2020-03-18 MED FILL — GABAPENTIN 300 MG CAPSULE: 300 | 90 days supply | Qty: 90 | Fill #0

## 2020-03-31 ENCOUNTER — Telehealth: Payer: Self-pay | Admitting: Nurse Practitioner

## 2020-03-31 NOTE — Telephone Encounter (Signed)
Copied from CRM [id number]. Topic: General - Inquiry >> Mar 30, 2020  2:18 PM Daphine Deutscher D wrote: Reason for CRM: Pt called saying the home health nurse came by yesterday and her blood pressure was/ 140/92  last night was 109/75 and then today 138/97.  She would like to speak with a nurse.  CB#  [phone]

## 2020-04-01 DIAGNOSIS — E785 Hyperlipidemia, unspecified: Secondary | ICD-10-CM | POA: Diagnosis not present

## 2020-04-01 DIAGNOSIS — Z008 Encounter for other general examination: Secondary | ICD-10-CM | POA: Diagnosis not present

## 2020-04-01 DIAGNOSIS — J309 Allergic rhinitis, unspecified: Secondary | ICD-10-CM | POA: Diagnosis not present

## 2020-04-01 DIAGNOSIS — R7303 Prediabetes: Secondary | ICD-10-CM | POA: Diagnosis not present

## 2020-04-01 DIAGNOSIS — R030 Elevated blood-pressure reading, without diagnosis of hypertension: Secondary | ICD-10-CM | POA: Diagnosis not present

## 2020-04-01 DIAGNOSIS — R42 Dizziness and giddiness: Secondary | ICD-10-CM | POA: Diagnosis not present

## 2020-04-01 DIAGNOSIS — G43909 Migraine, unspecified, not intractable, without status migrainosus: Secondary | ICD-10-CM | POA: Diagnosis not present

## 2020-04-01 DIAGNOSIS — Z0000 Encounter for general adult medical examination without abnormal findings: Secondary | ICD-10-CM | POA: Diagnosis not present

## 2020-04-01 DIAGNOSIS — M5410 Radiculopathy, site unspecified: Secondary | ICD-10-CM | POA: Diagnosis not present

## 2020-04-01 DIAGNOSIS — K219 Gastro-esophageal reflux disease without esophagitis: Secondary | ICD-10-CM | POA: Diagnosis not present

## 2020-04-07 ENCOUNTER — Telehealth: Payer: Self-pay | Admitting: Gastroenterology

## 2020-04-07 ENCOUNTER — Encounter: Payer: Medicare Other | Admitting: Gastroenterology

## 2020-04-07 NOTE — Telephone Encounter (Signed)
Hi Dr. Chales Abrahams., this patient just called to cancel procedure that was scheduled for this morning because she has severe migraine and her blood pressure keeps fluctuating. Patient will call back to reschedule. Thank you.

## 2020-04-08 ENCOUNTER — Other Ambulatory Visit: Payer: Self-pay | Admitting: Registered Nurse

## 2020-04-08 DIAGNOSIS — I10 Essential (primary) hypertension: Secondary | ICD-10-CM | POA: Diagnosis not present

## 2020-04-09 MED FILL — AMLODIPINE 2.5 MG TABLET: 2.5 | 30 days supply | Qty: 30 | Fill #0

## 2020-04-09 MED FILL — RIZATRIPTAN BENZOATE 10 MG: 10 | 30 days supply | Qty: 9 | Fill #1

## 2020-04-09 NOTE — Telephone Encounter (Signed)
Spoke to patient.  Patient stated her blood pressure has been running high, her recent reading was 140/98. Patient stated she is going to establish care w/ a different clinic.

## 2020-05-19 IMAGING — CT CT ANGIO HEAD
1 of 11 series · 5 of 33 positions shown · IV contrast (Omnipaque)
Comparison: None.

CLINICAL DATA: Dizziness

EXAM:
CT ANGIOGRAPHY HEAD AND NECK
TECHNIQUE: Multidetector CT imaging of the head and neck was performed using
the standard protocol during bolus administration of intravenous
contrast. Multiplanar CT image reconstructions and MIPs were
obtained to evaluate the vascular anatomy. Carotid stenosis
measurements (when applicable) are obtained utilizing NASCET
criteria, using the distal internal carotid diameter as the
denominator.
CONTRAST:  100mL OMNIPAQUE IOHEXOL 350 MG/ML SOLN

[Series 10: axial thin · axial · 0.47mm/px · z∈[-365,-137]mm · 5 of 343 slices shown]
[im 58/343  soft-tissue]
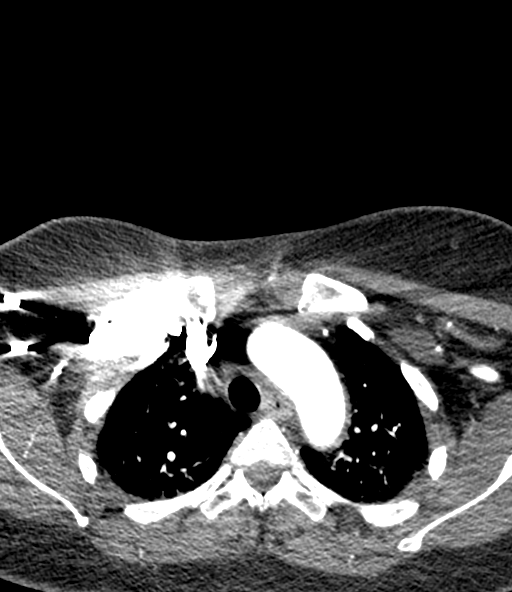
[im 115/343  bone]
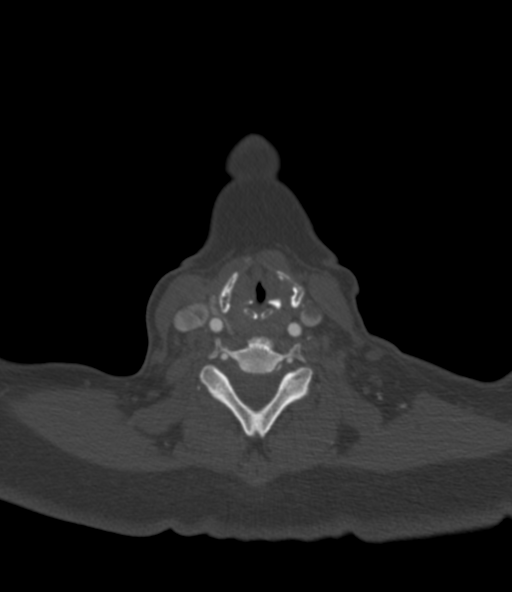
[im 172/343  soft-tissue]
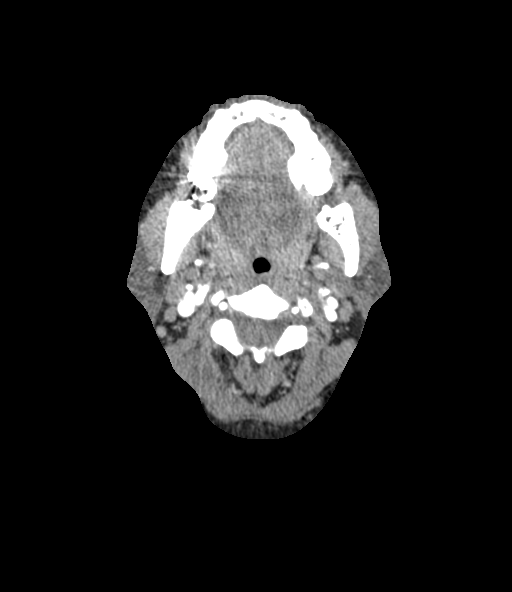
[im 229/343  bone]
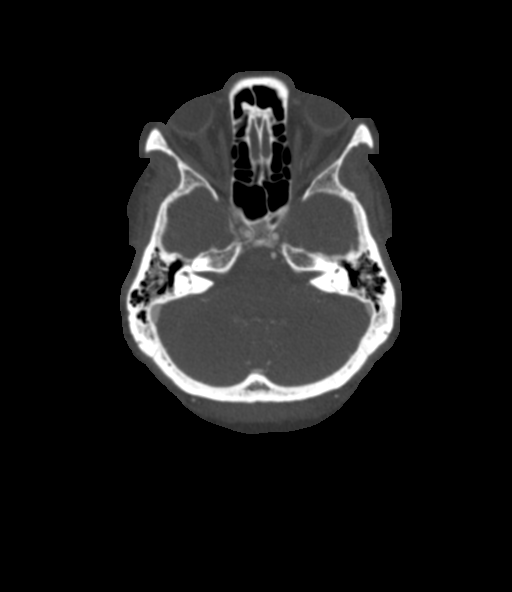
[im 286/343  soft-tissue]
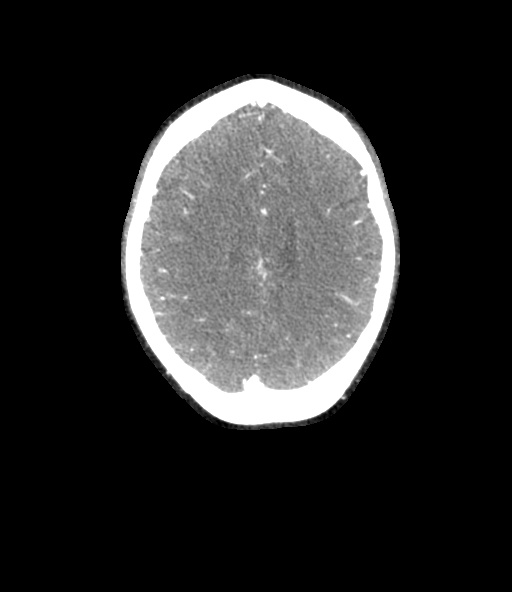

[5 of 33 positions shown; findings below may reference images not displayed]

FINDINGS: CT HEAD

Brain: There is no acute intracranial hemorrhage, mass effect, or
edema. Gray-white differentiation is preserved. There is no
extra-axial fluid collection. Ventricles and sulci are within normal
limits in size and configuration.

Vascular: No hyperdense vessel or unexpected calcification.

Skull: Calvarium is unremarkable.

Sinuses/Orbits: No acute finding.

Other: None.

Review of the MIP images confirms the above findings

CTA NECK

Aortic arch: Minimal calcified plaque. Great vessel origins are
patent.

Right carotid system: Patent. No measurable stenosis or evidence of
dissection.

Left carotid system: Patent. No measurable stenosis or evidence of
dissection.

Vertebral arteries: Patent. Right vertebral artery is slightly
dominant.

Skeleton: Minor degenerative changes of the cervical spine.

Other neck: No mass or adenopathy.

Upper chest: No apical lung mass.

Review of the MIP images confirms the above findings

CTA HEAD

Anterior circulation: Intracranial internal carotid arteries are
patent with trace calcified plaque. Anterior and middle cerebral
arteries are patent. An anterior communicating artery is present.

Posterior circulation: Intracranial vertebral arteries, basilar
artery, and posterior cerebral arteries are patent. Bilateral
posterior communicating arteries are present.

Venous sinuses: As permitted by contrast timing, patent.

Review of the MIP images confirms the above findings
IMPRESSION: No acute intracranial hemorrhage or evidence of acute infarction.

No large vessel occlusion, hemodynamically significant stenosis, or
evidence of dissection.

## 2020-05-20 ENCOUNTER — Other Ambulatory Visit: Payer: Self-pay | Admitting: Registered Nurse

## 2020-05-25 ENCOUNTER — Other Ambulatory Visit: Payer: Self-pay | Admitting: Registered Nurse

## 2020-05-29 ENCOUNTER — Other Ambulatory Visit: Payer: Self-pay | Admitting: Registered Nurse

## 2020-05-29 DIAGNOSIS — Z1231 Encounter for screening mammogram for malignant neoplasm of breast: Secondary | ICD-10-CM

## 2020-06-01 ENCOUNTER — Other Ambulatory Visit: Payer: Self-pay

## 2020-06-01 ENCOUNTER — Ambulatory Visit (AMBULATORY_SURGERY_CENTER): Payer: Medicare Other | Admitting: *Deleted

## 2020-06-01 ENCOUNTER — Other Ambulatory Visit: Payer: Self-pay | Admitting: Gastroenterology

## 2020-06-01 VITALS — Ht 64.0 in | Wt 177.0 lb

## 2020-06-01 DIAGNOSIS — Z1211 Encounter for screening for malignant neoplasm of colon: Secondary | ICD-10-CM

## 2020-06-01 MED ORDER — SUPREP BOWEL PREP KIT 17.5-3.13-1.6 GM/177ML PO SOLN: 1.0000 | Freq: Once | ORAL | 0 refills | Status: DC

## 2020-06-01 MED FILL — SUPREP BOWEL PREP KIT: 17.5-3.13-1 | 1 days supply | Qty: 354 | Fill #0

## 2020-06-01 NOTE — Progress Notes (Signed)
No egg or soy allergy known to patient  No issues with past sedation with any surgeries or procedures Patient denies ever being told they had issues or difficulty with intubation  No FH of Malignant Hyperthermia No diet pills per patient No home 02 use per patient  No blood thinners per patient  Pt denies issues with constipation but last colon fair prep- 2 day suprep for this colon   No A fib or A flutter  EMMI video to pt or via MyChart  COVID 19 guidelines implemented in PV today with Pt and RN  Pt is fully vaccinated  for Covid    Due to the COVID-19 pandemic we are asking patients to follow certain guidelines.  Pt aware of COVID protocols and [facility] guidelines   Pt verified name, DOB, address and insurance during PV today. Pt mailed instruction packet to included paper to complete and mail back to [facility] with addressed and stamped envelope, Emmi video, copy of consent form to read and not return, and instructions. PV completed over the phone. Pt encouraged to call with questions or issues. My Chart instructions to pt as well

## 2020-06-09 ENCOUNTER — Ambulatory Visit: Payer: Medicare Other | Admitting: Nurse Practitioner

## 2020-06-16 ENCOUNTER — Ambulatory Visit (AMBULATORY_SURGERY_CENTER): Payer: Medicare Other | Admitting: Gastroenterology

## 2020-06-16 ENCOUNTER — Other Ambulatory Visit: Payer: Self-pay

## 2020-06-16 ENCOUNTER — Encounter: Payer: Self-pay | Admitting: Gastroenterology

## 2020-06-16 VITALS — BP 134/91 | HR 87 | Temp 97.9°F | Resp 17 | Ht 64.0 in | Wt 177.0 lb

## 2020-06-16 DIAGNOSIS — Z800 Family history of malignant neoplasm of digestive organs: Secondary | ICD-10-CM

## 2020-06-16 DIAGNOSIS — Z1211 Encounter for screening for malignant neoplasm of colon: Secondary | ICD-10-CM

## 2020-06-16 MED ORDER — SODIUM CHLORIDE 0.9 % IV SOLN: 500.0000 mL | Freq: Once | INTRAVENOUS | Status: DC

## 2020-06-16 NOTE — Patient Instructions (Signed)
Handouts provided on diverticulosis, hemorrhoids and high fiber diet.   Recommend a high fiber diet.  Use Miralax 1 capful (17 grams) in 8 ounces of water by mouth daily.  YOU HAD AN ENDOSCOPIC PROCEDURE TODAY AT THE [facility]:   Refer to the procedure report that was given to you for any specific questions about what was found during the examination.  If the procedure report does not answer your questions, please call your gastroenterologist to clarify.  If you requested that your care partner not be given the details of your procedure findings, then the procedure report has been included in a sealed envelope for you to review at your convenience later.  YOU SHOULD EXPECT: Some feelings of bloating in the abdomen. Passage of more gas than usual.  Walking can help get rid of the air that was put into your GI tract during the procedure and reduce the bloating. If you had a lower endoscopy (such as a colonoscopy or flexible sigmoidoscopy) you may notice spotting of blood in your stool or on the toilet paper. If you underwent a bowel prep for your procedure, you may not have a normal bowel movement for a few days.  Please Note:  You might notice some irritation and congestion in your nose or some drainage.  This is from the oxygen used during your procedure.  There is no need for concern and it should clear up in a day or so.  SYMPTOMS TO REPORT IMMEDIATELY:   Following lower endoscopy (colonoscopy or flexible sigmoidoscopy):  Excessive amounts of blood in the stool  Significant tenderness or worsening of abdominal pains  Swelling of the abdomen that is new, acute  Fever of 100F or higher  For urgent or emergent issues, a gastroenterologist can be reached at any hour by calling [phone]. Do not use MyChart messaging for urgent concerns.    DIET:  We do recommend a small meal at first, but then you may proceed to your regular diet.  Drink plenty of fluids but you should  avoid alcoholic beverages for 24 hours.  ACTIVITY:  You should plan to take it easy for the rest of today and you should NOT DRIVE or use heavy machinery until tomorrow (because of the sedation medicines used during the test).    FOLLOW UP: Our staff will call the number listed on your records 48-72 hours following your procedure to check on you and address any questions or concerns that you may have regarding the information given to you following your procedure. If we do not reach you, we will leave a message.  We will attempt to reach you two times.  During this call, we will ask if you have developed any symptoms of COVID 19. If you develop any symptoms (ie: fever, flu-like symptoms, shortness of breath, cough etc.) before then, please call [phone].  If you test positive for Covid 19 in the 2 weeks post procedure, please call and report this information to us.    If any biopsies were taken you will be contacted by phone or by letter within the next 1-3 weeks.  Please call us at [phone] if you have not heard about the biopsies in 3 weeks.    SIGNATURES/CONFIDENTIALITY: You and/or your care partner have signed paperwork which will be entered into your electronic medical record.  These signatures attest to the fact that that the information above on your After Visit Summary has been reviewed and is understood.  Full responsibility   of the confidentiality of this discharge information lies with you and/or your care-partner.  

## 2020-06-16 NOTE — Progress Notes (Signed)
VS by CW  Pt's states no medical or surgical changes since previsit or office visit.  

## 2020-06-16 NOTE — Progress Notes (Signed)
To PACU, VSS. Report to [email or web address] 

## 2020-06-16 NOTE — Op Note (Signed)
[facility] Patient Name: Connie Arroyo Procedure Date: 06/16/2020 8:02 AM MRN: [id number] Endoscopist: Lynann Bologna , MD Age: 60 Referring MD:  Date of Birth: Mar 22, 1960 Gender: Female Account #: [id number] Procedure:                Colonoscopy Indications:              Colon cancer screening in patient at increased                            risk: Family history of 1st-degree relative with                            colon polyps (mom) Medicines:                Monitored Anesthesia Care Procedure:                Pre-Anesthesia Assessment:                           - Prior to the procedure, a History and Physical                            was performed, and patient medications and                            allergies were reviewed. The patient's tolerance of                            previous anesthesia was also reviewed. The risks                            and benefits of the procedure and the sedation                            options and risks were discussed with the patient.                            All questions were answered, and informed consent                            was obtained. Prior Anticoagulants: The patient has                            taken no previous anticoagulant or antiplatelet                            agents. ASA Grade Assessment: II - A patient with                            mild systemic disease. After reviewing the risks                            and benefits, the patient was deemed in  satisfactory condition to undergo the procedure.                           After obtaining informed consent, the colonoscope                            was passed under direct vision. Throughout the                            procedure, the patient's blood pressure, pulse, and                            oxygen saturations were monitored continuously. The                            Olympus PCF-H190DL ([id number]) Colonoscope  was                            introduced through the anus and advanced to the 2                            cm into the ileum. The colonoscopy was performed                            without difficulty. The patient tolerated the                            procedure well. The quality of the bowel                            preparation was adequate to identify polyps. The                            terminal ileum, ileocecal valve, appendiceal                            orifice, and rectum were photographed. Scope In: 8:10:08 AM Scope Out: 8:26:30 AM Scope Withdrawal Time: 0 hours 11 minutes 0 seconds  Total Procedure Duration: 0 hours 16 minutes 22 seconds  Findings:                 A few small-mouthed diverticula were found in the                            sigmoid colon.                           Non-bleeding internal hemorrhoids were found during                            retroflexion. The hemorrhoids were small.                           The terminal ileum appeared normal.  The exam was otherwise without abnormality on                            direct and retroflexion views. Complications:            No immediate complications. Estimated Blood Loss:     Estimated blood loss: none. Impression:               - Mild sigmoid diverticulosis.                           - Non-bleeding internal hemorrhoids.                           - The examined portion of the ileum was normal.                           - The examination was otherwise normal on direct                            and retroflexion views.                           - No specimens collected. Recommendation:           - Patient has a contact number available for                            emergencies. The signs and symptoms of potential                            delayed complications were discussed with the                            patient. Return to normal activities tomorrow.                             Written discharge instructions were provided to the                            patient.                           - High fiber diet.                           - Continue present medications.                           - Miralax 1 capful (17 grams) in 8 ounces of water                            PO daily.                           - Repeat colonoscopy in 5 years for screening  purposes d/t FH polyps with 2 day prep.                           - Return to GI office PRN. Lynann Bologna, MD 06/16/2020 8:31:07 AM This report has been signed electronically.

## 2020-06-18 ENCOUNTER — Telehealth: Payer: Self-pay

## 2020-06-18 NOTE — Telephone Encounter (Signed)
Patient returned the call and said she is well no questions at this time. 

## 2020-06-18 NOTE — Telephone Encounter (Signed)
First post procedure follow up call, no answer 

## 2020-06-30 ENCOUNTER — Other Ambulatory Visit: Payer: Self-pay

## 2020-06-30 MED FILL — Gabapentin Cap 300 MG: ORAL | 30 days supply | Qty: 30 | Fill #0 | Status: CN

## 2020-06-30 MED FILL — Meclizine HCl Tab 25 MG: ORAL | 30 days supply | Qty: 90 | Fill #0 | Status: CN

## 2020-07-07 ENCOUNTER — Other Ambulatory Visit: Payer: Self-pay

## 2020-07-07 ENCOUNTER — Other Ambulatory Visit: Payer: Self-pay | Admitting: Registered Nurse

## 2020-07-07 MED FILL — Gabapentin Cap 300 MG: ORAL | 30 days supply | Qty: 30 | Fill #0 | Status: CN

## 2020-07-08 ENCOUNTER — Other Ambulatory Visit: Payer: Self-pay

## 2020-07-09 NOTE — Progress Notes (Deleted)
NEUROLOGY FOLLOW UP OFFICE NOTE  KARSON CHICAS [id number]  Assessment/Plan:   ***  1.  Migraine prevention:  *** 2.  Migraine rescue:  *** 3. Limit use of pain relievers to no more than 2 days out of week to prevent risk of rebound or medication-overuse headache. 4.  Keep headache diary 5.  Follow up ***  Subjective:  Lindsey Demonte is a 60 year old right-handed female with chronic pain syndrome who follows up for headache.  UPDATE: Intensity:  *** Duration:  *** Frequency:  *** Frequency of abortive medication: *** Current NSAIDS/analgesics:  ASA 81mg  daily Current triptans:  rizatriptan 10mg  Current ergotamine:  none Current anti-emetic:  Zofran 4mg  Current muscle relaxants:  none Current Antihypertensive medications:  none Current Antidepressant medications:  nortriptyline 30mg  QHS, Lexapro 10mg  Current Anticonvulsant medications:  none Current anti-CGRP:  none Current Vitamins/Herbal/Supplements:  D Current Antihistamines/Decongestants:  none Other therapy:  none Hormone/birth control:  None Other medications:  Hydroxyzine  Drinks tea, ginger ale.  1 L water daily.  Rarely Pepsi.  No coffee.   Skips breakfast, sometimes skips lunch. Sleep is okay.  HISTORY: She has had migraines since her 57s.  It is a severe pounding headache either right temple or left occiput.  She has associated vertigo, nausea, photophobia, phonophobia, but no visual disturbance, vomiting, speech disturbance, numbness and weakness.  They typically last 2 to 7 days.  Headaches are almost daily.  She reports significant stress caring for her teenage granddaughter.    She has had episodes of syncope.  She had an MRI of brain without contrast performed on 01/25/2017 to evaluate syncopal spell associated with migraine, which showed "two punctate signal change foci of the subcortical white matter."  Family history of aneurysm.  CTA of head and neck from 05/16/2019 personally reviewed was  normal.  MRI of brain on 05/29/2019 personally reviewed and again showed few scattered T2 foci in the cerebral white matter but no acute findings.      Past NSAIDS/analgesics:  Ibuprofen, naproxen, Fioricet Past abortive triptans:  Relpax 40mg  (helpful but not covered) Past abortive ergotamine:  none Past muscle relaxants:  none Past anti-emetic:  none Past antihypertensive medications:  atenolol Past antidepressant medications:  none Past anticonvulsant medications:  Topiramate, Depakote Past anti-CGRP:  none Past vitamins/Herbal/Supplements:  none Past antihistamines/decongestants:  none Other past therapies:  Namenda, occipital nerve block (increased headache)  Family history:  Sister (migraines, aneurysm ruptured), mother (aneurysm), uncle (aneurysm).  She was checked for aneurysm and was negative.  PAST MEDICAL HISTORY: Past Medical History:  Diagnosis Date  . Allergy   . Anemia   . Anxiety   . Arthritis   . GERD (gastroesophageal reflux disease)   . High cholesterol   . History of stomach ulcers   . Hypercholesteremia   . Hypertension   . Migraine headache   . PONV (postoperative nausea and vomiting)   . Red blood cell abnormality    "small red blood cells" per pt    MEDICATIONS: Current Outpatient Medications on File Prior to Visit  Medication Sig Dispense Refill  . amLODipine (NORVASC) 2.5 MG tablet TAKE 1 TABLET (2.5 MG) BY ORAL ROUTE ONCE DAILY 90 tablet 3  . aspirin 81 MG tablet Take 81 mg by mouth daily.    atorvastatin (LIPITOR) 20 MG tablet Take 20 mg by mouth daily. (Patient not taking: Reported on 06/16/2020)    . cetirizine (ZYRTEC) 10 MG tablet Take 10 mg by mouth daily.    01/27/2017  Cyanocobalamin (VITAMIN B 12 PO) Take by mouth.    . cyclobenzaprine (FLEXERIL) 10 MG tablet TAKE 1 TABLET (10 MG) BY ORAL ROUTE 3 TIMES PER DAY AS NEEDED FOR MUSCLE SPASM 30 tablet 0  . diclofenac Sodium (VOLTAREN) 1 % GEL APPLY 2 GRAMS TOPICALLY 4 (FOUR) TIMES DAILY. 200 g 1   . escitalopram (LEXAPRO) 10 MG tablet TAKE 1 TABLET (10 MG TOTAL) BY MOUTH DAILY. 90 tablet 1  . gabapentin (NEURONTIN) 300 MG capsule TAKE 1 CAPSULE (300 MG) BY MOUTH DAILY AT BEDTIME 90 capsule 3  . hydrOXYzine (ATARAX/VISTARIL) 25 MG tablet TAKE 1 TABLET (25 MG) BY ORAL ROUTE 3 TIMES PER DAY AS NEEDED 90 tablet 0  . meclizine (ANTIVERT) 25 MG tablet TAKE 1 TABLET (25 MG) BY ORAL ROUTE 3 TIMES PER DAY AS NEEDED 90 tablet 3  . meloxicam (MOBIC) 15 MG tablet Take 15 mg by mouth daily. (Patient not taking: No sig reported)    . Multiple Vitamins-Minerals (ONE-A-DAY WOMENS PO) Take by mouth.    . nortriptyline (PAMELOR) 10 MG capsule TAKE 3 CAPSULES (30 MG) BY ORAL ROUTE ONCE DAILY AT BEDTIME 270 capsule 0  . ondansetron (ZOFRAN-ODT) 4 MG disintegrating tablet TAKE 1 TABLET (4 MG TOTAL) BY MOUTH EVERY 8 (EIGHT) HOURS AS NEEDED FOR NAUSEA OR VOMITING. 20 tablet 5  . pantoprazole (PROTONIX) 20 MG tablet TAKE 1 TABLET (20 MG) BY ORAL ROUTE ONCE DAILY 90 tablet 3  . rizatriptan (MAXALT) 10 MG tablet TAKE 1 TABLET (10 MG) BY ORAL ROUTE ONCE, MAY REPEAT AT 2 HOUR INTERVALS; DO NOT EXCEED 30 MG IN 24 HOURS 90 tablet 0  . rosuvastatin (CRESTOR) 10 MG tablet TAKE 1 TABLET (10 MG) BY ORAL ROUTE ONCE DAILY AT BEDTIME FOR 90 DAYS 90 tablet 3   No current facility-administered medications on file prior to visit.    ALLERGIES: Allergies  Allergen Reactions  . Benadryl [Diphenhydramine] Other (See Comments)    Agitation and hallucinations  . Codeine Nausea And Vomiting    No rash, swelling or other allergic sx  . Penicillins Nausea And Vomiting    FAMILY HISTORY: Family History  Problem Relation Age of Onset  . Hypertension Mother   . Colon polyps Mother   . Diabetes Father   . Hypertension Father   . Stroke Father   . Anuerysm Sister   . Stroke Sister   . Breast cancer Paternal Grandmother   . Breast cancer Paternal Aunt   . Narcolepsy Paternal Aunt   . Colon cancer Neg Hx   . Esophageal  cancer Neg Hx   . Rectal cancer Neg Hx   . Stomach cancer Neg Hx       Objective:  *** General: No acute distress.  Patient appears well-groomed.   Head:  Normocephalic/atraumatic Eyes:  Fundi examined but not visualized Neck: supple, no paraspinal tenderness, full range of motion Heart:  Regular rate and rhythm Lungs:  Clear to auscultation bilaterally Back: No paraspinal tenderness Neurological Exam: alert and oriented to person, place, and time. Attention span and concentration intact, recent and remote memory intact, fund of knowledge intact.  Speech fluent and not dysarthric, language intact.  CN II-XII intact. Bulk and tone normal, muscle strength 5/5 throughout.  Sensation to light touch, temperature and vibration intact.  Deep tendon reflexes 2+ throughout, toes downgoing.  Finger to nose and heel to shin testing intact.  Gait normal, Romberg negative.     Shon Millet, DO

## 2020-07-10 ENCOUNTER — Ambulatory Visit: Payer: Medicare Other | Admitting: Neurology

## 2020-07-13 NOTE — Progress Notes (Deleted)
NEUROLOGY FOLLOW UP OFFICE NOTE  Connie Arroyo [id number]  Assessment/Plan:   ***  Subjective:  Connie Arroyo is a 60 year old right-handed female with chronic pain syndrome who follows up for headaches.  UPDATE: Intensity:  *** Duration:  *** Frequency:  *** Frequency of abortive medication: *** Current NSAIDS/analgesics:  ASA 81mg  daily Current triptans:  rizatriptan 10mg  Current ergotamine:  none Current anti-emetic:  Zofran 4mg  Current muscle relaxants:  none Current Antihypertensive medications:  none Current Antidepressant medications:  nortriptyline 30mg  QHS, Lexapro 10mg  Current Anticonvulsant medications:  none Current anti-CGRP:  none Current Vitamins/Herbal/Supplements:  D Current Antihistamines/Decongestants:  none Other therapy:  none Hormone/birth control:  None Other medications:  Hydroxyzine  Drinks tea, ginger ale.  1 L water daily.  Rarely Pepsi.  No coffee.   Skips breakfast, sometimes skips lunch. Sleep is okay.  HISTORY: She has had migraines since her 53s.  It is a severe pounding headache either right temple or left occiput.  She has associated vertigo, nausea, photophobia, phonophobia, but no visual disturbance, vomiting, speech disturbance, numbness and weakness.  They typically last 2 to 7 days.  Headaches are almost daily.  She reports significant stress caring for her teenage granddaughter.    She has had episodes of syncope.  She had an MRI of brain without contrast performed on 01/25/2017 to evaluate syncopal spell associated with migraine, which showed "two punctate signal change foci of the subcortical white matter."  Family history of aneurysm.  CTA of head and neck from 05/16/2019 personally reviewed was normal.  MRI of brain on 05/29/2019 personally reviewed and again showed few scattered T2 foci in the cerebral white matter but no acute findings.     Past NSAIDS/analgesics:  Ibuprofen, naproxen, Fioricet Past abortive  triptans:  Relpax 40mg  (helpful but not covered) Past abortive ergotamine:  none Past muscle relaxants:  none Past anti-emetic:  none Past antihypertensive medications:  atenolol Past antidepressant medications:  none Past anticonvulsant medications:  Topiramate, Depakote Past anti-CGRP:  none Past vitamins/Herbal/Supplements:  none Past antihistamines/decongestants:  none Other past therapies:  Namenda, occipital nerve block (increased headache)   Family history:  Sister (migraines, aneurysm ruptured), mother (aneurysm), uncle (aneurysm).  She was checked for aneurysm and was negative.  PAST MEDICAL HISTORY: Past Medical History:  Diagnosis Date  . Allergy   . Anemia   . Anxiety   . Arthritis   . GERD (gastroesophageal reflux disease)   . High cholesterol   . History of stomach ulcers   . Hypercholesteremia   . Hypertension   . Migraine headache   . PONV (postoperative nausea and vomiting)   . Red blood cell abnormality    "small red blood cells" per pt    MEDICATIONS: Current Outpatient Medications on File Prior to Visit  Medication Sig Dispense Refill  . amLODipine (NORVASC) 2.5 MG tablet TAKE 1 TABLET (2.5 MG) BY ORAL ROUTE ONCE DAILY 90 tablet 3  . aspirin 81 MG tablet Take 81 mg by mouth daily.    atorvastatin (LIPITOR) 20 MG tablet Take 20 mg by mouth daily. (Patient not taking: Reported on 06/16/2020)    . cetirizine (ZYRTEC) 10 MG tablet Take 10 mg by mouth daily.    . Cyanocobalamin (VITAMIN B 12 PO) Take by mouth.    . cyclobenzaprine (FLEXERIL) 10 MG tablet TAKE 1 TABLET (10 MG) BY ORAL ROUTE 3 TIMES PER DAY AS NEEDED FOR MUSCLE SPASM 30 tablet 0  . diclofenac Sodium (VOLTAREN) 1 %  GEL APPLY 2 GRAMS TOPICALLY 4 (FOUR) TIMES DAILY. 200 g 1  . escitalopram (LEXAPRO) 10 MG tablet TAKE 1 TABLET (10 MG TOTAL) BY MOUTH DAILY. 90 tablet 1  . gabapentin (NEURONTIN) 300 MG capsule TAKE 1 CAPSULE (300 MG) BY MOUTH DAILY AT BEDTIME 90 capsule 3  . hydrOXYzine  (ATARAX/VISTARIL) 25 MG tablet TAKE 1 TABLET (25 MG) BY ORAL ROUTE 3 TIMES PER DAY AS NEEDED 90 tablet 0  . meclizine (ANTIVERT) 25 MG tablet TAKE 1 TABLET (25 MG) BY ORAL ROUTE 3 TIMES PER DAY AS NEEDED 90 tablet 3  . meloxicam (MOBIC) 15 MG tablet Take 15 mg by mouth daily. (Patient not taking: No sig reported)    . Multiple Vitamins-Minerals (ONE-A-DAY WOMENS PO) Take by mouth.    . nortriptyline (PAMELOR) 10 MG capsule TAKE 3 CAPSULES (30 MG) BY ORAL ROUTE ONCE DAILY AT BEDTIME 270 capsule 0  . ondansetron (ZOFRAN-ODT) 4 MG disintegrating tablet TAKE 1 TABLET (4 MG TOTAL) BY MOUTH EVERY 8 (EIGHT) HOURS AS NEEDED FOR NAUSEA OR VOMITING. 20 tablet 5  . pantoprazole (PROTONIX) 20 MG tablet TAKE 1 TABLET (20 MG) BY ORAL ROUTE ONCE DAILY 90 tablet 3  . rizatriptan (MAXALT) 10 MG tablet TAKE 1 TABLET (10 MG) BY ORAL ROUTE ONCE, MAY REPEAT AT 2 HOUR INTERVALS; DO NOT EXCEED 30 MG IN 24 HOURS 90 tablet 0  . rosuvastatin (CRESTOR) 10 MG tablet TAKE 1 TABLET (10 MG) BY ORAL ROUTE ONCE DAILY AT BEDTIME FOR 90 DAYS 90 tablet 3   No current facility-administered medications on file prior to visit.    ALLERGIES: Allergies  Allergen Reactions  . Benadryl [Diphenhydramine] Other (See Comments)    Agitation and hallucinations  . Codeine Nausea And Vomiting    No rash, swelling or other allergic sx  . Penicillins Nausea And Vomiting    FAMILY HISTORY: Family History  Problem Relation Age of Onset  . Hypertension Mother   . Colon polyps Mother   . Diabetes Father   . Hypertension Father   . Stroke Father   . Anuerysm Sister   . Stroke Sister   . Breast cancer Paternal Grandmother   . Breast cancer Paternal Aunt   . Narcolepsy Paternal Aunt   . Colon cancer Neg Hx   . Esophageal cancer Neg Hx   . Rectal cancer Neg Hx   . Stomach cancer Neg Hx       Objective:  *** General: No acute distress.  Patient appears ***-groomed.   Head:  Normocephalic/atraumatic Eyes:  Fundi examined but not  visualized Neck: supple, no paraspinal tenderness, full range of motion Heart:  Regular rate and rhythm Lungs:  Clear to auscultation bilaterally Back: No paraspinal tenderness Neurological Exam: alert and oriented to person, place, and time. Attention span and concentration intact, recent and remote memory intact, fund of knowledge intact.  Speech fluent and not dysarthric, language intact.  CN II-XII intact. Bulk and tone normal, muscle strength 5/5 throughout.  Sensation to light touch, temperature and vibration intact.  Deep tendon reflexes 2+ throughout, toes downgoing.  Finger to nose and heel to shin testing intact.  Gait normal, Romberg negative.     Shon Millet, DO  CC: Loura Back, NP

## 2020-07-14 ENCOUNTER — Other Ambulatory Visit: Payer: Self-pay

## 2020-07-14 ENCOUNTER — Ambulatory Visit: Payer: Medicare Other | Admitting: Neurology

## 2020-07-17 ENCOUNTER — Other Ambulatory Visit: Payer: Self-pay

## 2020-07-17 MED FILL — Gabapentin Cap 300 MG: ORAL | 90 days supply | Qty: 90 | Fill #0 | Status: AC

## 2020-07-17 MED FILL — Nortriptyline HCl Cap 10 MG: ORAL | 90 days supply | Qty: 270 | Fill #0 | Status: AC

## 2020-07-22 ENCOUNTER — Ambulatory Visit: Payer: Medicare Other

## 2020-07-23 NOTE — Progress Notes (Deleted)
NEUROLOGY FOLLOW UP OFFICE NOTE  Connie Arroyo [id number]  Assessment/Plan:   ***  Subjective:  Connie Arroyo is a 60 year old right-handed female with chronic pain syndrome who follows up for migraines.  UPDATE: Intensity:  *** Duration:  *** Frequency:  *** Frequency of abortive medication: *** Current NSAIDS/analgesics:  ASA 81mg  daily Current triptans:  rizatriptan 10mg  Current ergotamine:  none Current anti-emetic:  none Current muscle relaxants:  none Current Antihypertensive medications:  none Current Antidepressant medications:  nortriptyline 50mg  qhs, Lexapro 10mg  Current Anticonvulsant medications:  none Current anti-CGRP:  none Current Vitamins/Herbal/Supplements:  D Current Antihistamines/Decongestants:  none Other therapy:  none Hormone/birth control:  None Other medications:  Hydroxyzine  Drinks tea, ginger ale.  1 L water daily.  Rarely Pepsi.  No coffee.   Skips breakfast, sometimes skips lunch. Sleep is okay.  HISTORY: She has had migraines since her 57s.  It is a severe pounding headache either right temple or left occiput.  She has associated vertigo, nausea, photophobia, phonophobia, but no visual disturbance, vomiting, speech disturbance, numbness and weakness.  They typically last 2 to 7 days.  Headaches are almost daily.  She reports significant stress caring for her teenage granddaughter.    She has had episodes of syncope.  She had an MRI of brain without contrast performed on 01/25/2017 to evaluate syncopal spell associated with migraine, which showed "two punctate signal change foci of the subcortical white matter."  Family history of aneurysm.  CTA of head and neck from 05/16/2019 personally reviewed was normal.  MRI of brain on 05/29/2019 personally reviewed and again showed few scattered T2 foci in the cerebral white matter but no acute findings.     Past NSAIDS/analgesics:  Ibuprofen, naproxen, Fioricet Past abortive triptans:   Relpax 40mg  (helpful but not covered) Past abortive ergotamine:  none Past muscle relaxants:  none Past anti-emetic:  none Past antihypertensive medications:  atenolol Past antidepressant medications:  none Past anticonvulsant medications:  Topiramate, Depakote Past anti-CGRP:  none Past vitamins/Herbal/Supplements:  none Past antihistamines/decongestants:  none Other past therapies:  Namenda, occipital nerve block (increased headache)   Family history:  Sister (migraines, aneurysm ruptured), mother (aneurysm), uncle (aneurysm).  She was checked for aneurysm and was negative.  PAST MEDICAL HISTORY: Past Medical History:  Diagnosis Date  . Allergy   . Anemia   . Anxiety   . Arthritis   . GERD (gastroesophageal reflux disease)   . High cholesterol   . History of stomach ulcers   . Hypercholesteremia   . Hypertension   . Migraine headache   . PONV (postoperative nausea and vomiting)   . Red blood cell abnormality    "small red blood cells" per pt    MEDICATIONS: Current Outpatient Medications on File Prior to Visit  Medication Sig Dispense Refill  . amLODipine (NORVASC) 2.5 MG tablet TAKE 1 TABLET (2.5 MG) BY ORAL ROUTE ONCE DAILY 90 tablet 3  . aspirin 81 MG tablet Take 81 mg by mouth daily.    49s atorvastatin (LIPITOR) 20 MG tablet Take 20 mg by mouth daily. (Patient not taking: Reported on 06/16/2020)    . cetirizine (ZYRTEC) 10 MG tablet Take 10 mg by mouth daily.    . Cyanocobalamin (VITAMIN B 12 PO) Take by mouth.    . cyclobenzaprine (FLEXERIL) 10 MG tablet TAKE 1 TABLET (10 MG) BY ORAL ROUTE 3 TIMES PER DAY AS NEEDED FOR MUSCLE SPASM 30 tablet 0  . diclofenac Sodium (VOLTAREN) 1 %  GEL APPLY 2 GRAMS TOPICALLY 4 (FOUR) TIMES DAILY. 200 g 1  . escitalopram (LEXAPRO) 10 MG tablet TAKE 1 TABLET (10 MG TOTAL) BY MOUTH DAILY. 90 tablet 1  . gabapentin (NEURONTIN) 300 MG capsule TAKE 1 CAPSULE (300 MG) BY MOUTH DAILY AT BEDTIME 90 capsule 3  . hydrOXYzine (ATARAX/VISTARIL) 25  MG tablet TAKE 1 TABLET (25 MG) BY ORAL ROUTE 3 TIMES PER DAY AS NEEDED 90 tablet 0  . meclizine (ANTIVERT) 25 MG tablet TAKE 1 TABLET (25 MG) BY ORAL ROUTE 3 TIMES PER DAY AS NEEDED 90 tablet 3  . meloxicam (MOBIC) 15 MG tablet Take 15 mg by mouth daily. (Patient not taking: No sig reported)    . Multiple Vitamins-Minerals (ONE-A-DAY WOMENS PO) Take by mouth.    . nortriptyline (PAMELOR) 10 MG capsule TAKE 3 CAPSULES (30 MG) BY ORAL ROUTE ONCE DAILY AT BEDTIME 270 capsule 0  . ondansetron (ZOFRAN-ODT) 4 MG disintegrating tablet TAKE 1 TABLET (4 MG TOTAL) BY MOUTH EVERY 8 (EIGHT) HOURS AS NEEDED FOR NAUSEA OR VOMITING. 20 tablet 5  . pantoprazole (PROTONIX) 20 MG tablet TAKE 1 TABLET (20 MG) BY ORAL ROUTE ONCE DAILY 90 tablet 3  . rizatriptan (MAXALT) 10 MG tablet TAKE 1 TABLET (10 MG) BY ORAL ROUTE ONCE, MAY REPEAT AT 2 HOUR INTERVALS; DO NOT EXCEED 30 MG IN 24 HOURS 90 tablet 0  . rosuvastatin (CRESTOR) 10 MG tablet TAKE 1 TABLET (10 MG) BY ORAL ROUTE ONCE DAILY AT BEDTIME FOR 90 DAYS 90 tablet 3   No current facility-administered medications on file prior to visit.    ALLERGIES: Allergies  Allergen Reactions  . Benadryl [Diphenhydramine] Other (See Comments)    Agitation and hallucinations  . Codeine Nausea And Vomiting    No rash, swelling or other allergic sx  . Penicillins Nausea And Vomiting    FAMILY HISTORY: Family History  Problem Relation Age of Onset  . Hypertension Mother   . Colon polyps Mother   . Diabetes Father   . Hypertension Father   . Stroke Father   . Anuerysm Sister   . Stroke Sister   . Breast cancer Paternal Grandmother   . Breast cancer Paternal Aunt   . Narcolepsy Paternal Aunt   . Colon cancer Neg Hx   . Esophageal cancer Neg Hx   . Rectal cancer Neg Hx   . Stomach cancer Neg Hx       Objective:  *** General: No acute distress.  Patient appears ***-groomed.   Head:  Normocephalic/atraumatic Eyes:  Fundi examined but not visualized Neck:  supple, no paraspinal tenderness, full range of motion Heart:  Regular rate and rhythm Lungs:  Clear to auscultation bilaterally Back: No paraspinal tenderness Neurological Exam: alert and oriented to person, place, and time. Attention span and concentration intact, recent and remote memory intact, fund of knowledge intact.  Speech fluent and not dysarthric, language intact.  CN II-XII intact. Bulk and tone normal, muscle strength 5/5 throughout.  Sensation to light touch, temperature and vibration intact.  Deep tendon reflexes 2+ throughout, toes downgoing.  Finger to nose and heel to shin testing intact.  Gait normal, Romberg negative.     Shon Millet, DO  CC: ***

## 2020-07-24 ENCOUNTER — Ambulatory Visit: Payer: Medicare Other | Admitting: Neurology

## 2020-07-28 ENCOUNTER — Ambulatory Visit: Payer: Medicare Other

## 2020-08-04 ENCOUNTER — Ambulatory Visit: Payer: Medicare Other

## 2020-08-25 ENCOUNTER — Other Ambulatory Visit: Payer: Self-pay

## 2020-08-25 MED FILL — Rosuvastatin Calcium Tab 10 MG: ORAL | 90 days supply | Qty: 90 | Fill #0 | Status: AC

## 2020-08-25 MED FILL — Amlodipine Besylate Tab 2.5 MG (Base Equivalent): ORAL | 90 days supply | Qty: 90 | Fill #0 | Status: AC

## 2020-09-03 ENCOUNTER — Other Ambulatory Visit: Payer: Self-pay

## 2020-09-03 MED ORDER — CYCLOBENZAPRINE HCL 10 MG PO TABS
ORAL_TABLET | ORAL | 0 refills | Status: DC
  Filled 2020-09-03: qty 30, 10d supply, fill #0

## 2020-10-18 NOTE — Progress Notes (Signed)
NEUROLOGY FOLLOW UP OFFICE NOTE  Connie Arroyo [id number]  Assessment/Plan:   Chronic migraine without aura, without status migrainosus, not intractable Vertigo - peripheral vs migraine-related  Migraine prevention:  increase nortriptyline to 50mg  at bedtime.  If no improvement in 6 weeks, consider switching to CGRP inhibitor Migraine rescue:  Will have her try samples of Ubrelvy Limit use of pain relievers to no more than 2 days out of week to prevent risk of rebound or medication-overuse headache. Keep headache diary Follow up 6 months.   Subjective:  Connie Arroyo is a 60 year old right-handed female with chronic pain syndrome who follows up for headaches.  UPDATE: Advised to take nortriptyline 30mg  at bedtime. No improvement. Intensity:  moderate to severe Duration:  several hours Frequency:  almost daily Frequency of abortive medication: rizatriptan 2 to 3 days a week. Current NSAIDS/analgesics:  ASA 81mg  daily Current triptans:  rizatriptan 10mg  Current ergotamine:  none Current anti-emetic:  Zofran ODT 4mg  Current muscle relaxants:  none Current Antihypertensive medications:  none Current Antidepressant medications:  nortriptyline 30mg  QHS, Lexapro 10mg  Current Anticonvulsant medications:  gabapentin 300mg  QHS Current anti-CGRP:  none Current Vitamins/Herbal/Supplements:  D Current Antihistamines/Decongestants:  none Other therapy:  none Hormone/birth control:  None Other medications:  Hydroxyzine  Drinks tea, ginger ale.  1 L water daily.  Rarely Pepsi.  No coffee.   Skips breakfast, sometimes skips lunch. Sleep is okay.  HISTORY:  She has had migraines since her 51s.  It is a severe pounding headache either right temple or left occiput.  She has associated vertigo, nausea, photophobia, phonophobia, but no visual disturbance, vomiting, speech disturbance, numbness and weakness.  They typically last 2 to 7 days.  Headaches are almost daily.  She reports  significant stress caring for her teenage granddaughter.     She has had episodes of syncope.  She had an MRI of brain without contrast performed on 01/25/2017 to evaluate syncopal spell associated with migraine, which showed "two punctate signal change foci of the subcortical white matter."  She also has history of vertigo.     Family history of aneurysm.  CTA of head and neck from 05/16/2019 as normal.  MRI of brain on 05/29/2019 personally reviewed and T2 foci in the cerebral white matter but no acute findings.      Past NSAIDS/analgesics:  Ibuprofen, naproxen, Fioricet Past abortive triptans:  Relpax 40mg  (helpful but not covered) Past abortive ergotamine:  none Past muscle relaxants:  none Past anti-emetic:  none Past antihypertensive medications:  atenolol Past antidepressant medications:  none Past anticonvulsant medications:  Topiramate, Depakote Past anti-CGRP:  none Past vitamins/Herbal/Supplements:  none Past antihistamines/decongestants:  none Other past therapies:  Namenda, occipital nerve block (increased headache)    Family history:  Sister (migraines, aneurysm ruptured), mother (aneurysm), uncle (aneurysm).  She was checked for aneurysm and was negative.  PAST MEDICAL HISTORY: Past Medical History:  Diagnosis Date   Allergy    Anemia    Anxiety    Arthritis    GERD (gastroesophageal reflux disease)    High cholesterol    History of stomach ulcers    Hypercholesteremia    Hypertension    Migraine headache    PONV (postoperative nausea and vomiting)    Red blood cell abnormality    "small red blood cells" per pt    MEDICATIONS: Current Outpatient Medications on File Prior to Visit  Medication Sig Dispense Refill   amLODipine (NORVASC) 2.5 MG tablet TAKE 1 TABLET (  2.5 MG) BY ORAL ROUTE ONCE DAILY 90 tablet 3   aspirin 81 MG tablet Take 81 mg by mouth daily.     atorvastatin (LIPITOR) 20 MG tablet Take 20 mg by mouth daily. (Patient not taking: Reported on  06/16/2020)     cetirizine (ZYRTEC) 10 MG tablet Take 10 mg by mouth daily.     Cyanocobalamin (VITAMIN B 12 PO) Take by mouth.     cyclobenzaprine (FLEXERIL) 10 MG tablet TAKE 1 TABLET (10 MG) BY ORAL ROUTE 3 TIMES PER DAY AS NEEDED FOR MUSCLE SPASM 30 tablet 0   cyclobenzaprine (FLEXERIL) 10 MG tablet take 1 tablet (10 mg) by oral route 3 times per day as needed for muscle spasm 30 tablet 0   diclofenac Sodium (VOLTAREN) 1 % GEL APPLY 2 GRAMS TOPICALLY 4 (FOUR) TIMES DAILY. 200 g 1   escitalopram (LEXAPRO) 10 MG tablet TAKE 1 TABLET (10 MG TOTAL) BY MOUTH DAILY. 90 tablet 1   gabapentin (NEURONTIN) 300 MG capsule TAKE 1 CAPSULE (300 MG) BY MOUTH DAILY AT BEDTIME 90 capsule 3   hydrOXYzine (ATARAX/VISTARIL) 25 MG tablet TAKE 1 TABLET (25 MG) BY ORAL ROUTE 3 TIMES PER DAY AS NEEDED 90 tablet 0   meclizine (ANTIVERT) 25 MG tablet TAKE 1 TABLET (25 MG) BY ORAL ROUTE 3 TIMES PER DAY AS NEEDED 90 tablet 3   meloxicam (MOBIC) 15 MG tablet Take 15 mg by mouth daily. (Patient not taking: No sig reported)     Multiple Vitamins-Minerals (ONE-A-DAY WOMENS PO) Take by mouth.     nortriptyline (PAMELOR) 10 MG capsule TAKE 3 CAPSULES (30 MG) BY ORAL ROUTE ONCE DAILY AT BEDTIME 270 capsule 0   ondansetron (ZOFRAN-ODT) 4 MG disintegrating tablet TAKE 1 TABLET (4 MG TOTAL) BY MOUTH EVERY 8 (EIGHT) HOURS AS NEEDED FOR NAUSEA OR VOMITING. 20 tablet 5   pantoprazole (PROTONIX) 20 MG tablet TAKE 1 TABLET (20 MG) BY ORAL ROUTE ONCE DAILY 90 tablet 3   rizatriptan (MAXALT) 10 MG tablet TAKE 1 TABLET (10 MG) BY ORAL ROUTE ONCE, MAY REPEAT AT 2 HOUR INTERVALS; DO NOT EXCEED 30 MG IN 24 HOURS 90 tablet 0   rosuvastatin (CRESTOR) 10 MG tablet TAKE 1 TABLET (10 MG) BY ORAL ROUTE ONCE DAILY AT BEDTIME FOR 90 DAYS 90 tablet 3   No current facility-administered medications on file prior to visit.    ALLERGIES: Allergies  Allergen Reactions   Benadryl [Diphenhydramine] Other (See Comments)    Agitation and hallucinations    Codeine Nausea And Vomiting    No rash, swelling or other allergic sx   Penicillins Nausea And Vomiting    FAMILY HISTORY: Family History  Problem Relation Age of Onset   Hypertension Mother    Colon polyps Mother    Diabetes Father    Hypertension Father    Stroke Father    Anuerysm Sister    Stroke Sister    Breast cancer Paternal Grandmother    Breast cancer Paternal Aunt    Narcolepsy Paternal Aunt    Colon cancer Neg Hx    Esophageal cancer Neg Hx    Rectal cancer Neg Hx    Stomach cancer Neg Hx       Objective:  Blood pressure 122/75, pulse (!) 103, height 5\' 4"  (1.626 m), weight 187 lb (84.8 kg), SpO2 98 %. General: No acute distress.  Patient appears well-groomed.     , DO  CC: Shon Millet, MD

## 2020-10-19 ENCOUNTER — Ambulatory Visit (INDEPENDENT_AMBULATORY_CARE_PROVIDER_SITE_OTHER): Payer: Medicare Other | Admitting: Neurology

## 2020-10-19 ENCOUNTER — Encounter: Payer: Self-pay | Admitting: Neurology

## 2020-10-19 ENCOUNTER — Other Ambulatory Visit: Payer: Self-pay

## 2020-10-19 ENCOUNTER — Other Ambulatory Visit: Payer: Self-pay | Admitting: Registered Nurse

## 2020-10-19 VITALS — BP 122/75 | HR 103 | Ht 64.0 in | Wt 187.0 lb

## 2020-10-19 DIAGNOSIS — G43709 Chronic migraine without aura, not intractable, without status migrainosus: Secondary | ICD-10-CM

## 2020-10-19 DIAGNOSIS — R42 Dizziness and giddiness: Secondary | ICD-10-CM

## 2020-10-19 MED ORDER — NORTRIPTYLINE HCL 50 MG PO CAPS
50.0000 mg | ORAL_CAPSULE | Freq: Every day | ORAL | 5 refills | Status: DC
  Filled 2020-10-19: qty 30, 30d supply, fill #0
  Filled 2020-11-14: qty 30, 30d supply, fill #1

## 2020-10-19 MED FILL — Gabapentin Cap 300 MG: ORAL | 90 days supply | Qty: 90 | Fill #1 | Status: AC

## 2020-10-19 MED FILL — Pantoprazole Sodium EC Tab 20 MG (Base Equiv): ORAL | 90 days supply | Qty: 90 | Fill #0 | Status: AC

## 2020-10-19 MED FILL — Escitalopram Oxalate Tab 10 MG (Base Equiv): ORAL | 90 days supply | Qty: 90 | Fill #0 | Status: AC

## 2020-10-19 NOTE — Patient Instructions (Signed)
Increase nortriptyline to 50mg  at bedtime.  If no improvement in 6 weeks, contact me When you get a headache, take Ubrelvy.  May repeat in 2 hours.  Maximum 2 tablets in 24 hours.  If effective, contact me for prescription.   Limit use of pain relievers to no more than 2 days out of week to prevent risk of rebound or medication-overuse headache. Keep headache diary Follow up 6 months.

## 2020-10-21 ENCOUNTER — Other Ambulatory Visit: Payer: Self-pay

## 2020-10-21 ENCOUNTER — Telehealth: Payer: Self-pay | Admitting: Neurology

## 2020-10-21 ENCOUNTER — Ambulatory Visit: Payer: Medicare Other

## 2020-10-21 DIAGNOSIS — G43709 Chronic migraine without aura, not intractable, without status migrainosus: Secondary | ICD-10-CM

## 2020-10-21 MED ORDER — UBRELVY 100 MG PO TABS
100.0000 mg | ORAL_TABLET | ORAL | 0 refills | Status: DC | PRN
  Filled 2020-10-21: qty 16, 30d supply, fill #0

## 2020-10-21 NOTE — Telephone Encounter (Signed)
Pt said she called her insurance and they told her she would need a PA for [place]. Stated it does work for her, she has 2 samples left. She would like to know what is her next step.

## 2020-10-21 NOTE — Telephone Encounter (Signed)
Telephone call to pt, per pt her insurance will not cover ubrelvy. Not on the formulary.    Pt likes the [place] and it works.   Advise we will start a PA.

## 2020-10-26 ENCOUNTER — Telehealth: Payer: Self-pay | Admitting: Neurology

## 2020-10-26 NOTE — Telephone Encounter (Signed)
Per Pt the Urbelvy did help. Pt to come by and get samples of Urbelvy.   Advised PA pending.    Connie Arroyo is there a update on the status of the PA

## 2020-10-26 NOTE — Telephone Encounter (Signed)
Pt would like to know if she can get more samples of Ubrelvy. She has taken what she had.

## 2020-10-27 NOTE — Telephone Encounter (Signed)
F/u   Key: [id number] Raisin 100MG  tablets   Wait for Determination  Please wait for [employer] 2017 NCP[employer] to return a determination.

## 2020-10-27 NOTE — Telephone Encounter (Signed)
Sent to pa folder

## 2020-11-03 ENCOUNTER — Telehealth: Payer: Self-pay | Admitting: Neurology

## 2020-11-03 NOTE — Telephone Encounter (Signed)
Tried calling pt, Unable to reach pt.

## 2020-11-03 NOTE — Telephone Encounter (Signed)
Pt called, would like a call back from the [facility] nurse. Pt said her wont ins wont pay, and she would like to know what medication jaffe is going to put her on

## 2020-11-04 NOTE — Telephone Encounter (Signed)
Telephone call to pt, advised pt to call her insurance to get the medications names on the formulary so we can call them in.

## 2020-11-04 NOTE — Telephone Encounter (Signed)
Pt called back in and left message with the access nurse. She stated her insurance will not pay for her headache medication. She has 2 bottles of the Ubrelvy samples. Needs to order something through formulary.

## 2020-11-04 NOTE — Telephone Encounter (Signed)
Pt called back in stating she will have to take Imitrex. She needs it called in to [facility] and [employer]

## 2020-11-04 NOTE — Telephone Encounter (Signed)
Per Pt It has to be Imtrex pills. She thinks 100 mg is what they cover.  Pt advised dr.Jaffe out of the office for the rest of the day. May send script in the morning.

## 2020-11-05 ENCOUNTER — Other Ambulatory Visit: Payer: Self-pay | Admitting: Neurology

## 2020-11-05 ENCOUNTER — Other Ambulatory Visit: Payer: Self-pay

## 2020-11-05 MED ORDER — SUMATRIPTAN SUCCINATE 100 MG PO TABS
ORAL_TABLET | ORAL | 5 refills | Status: DC
  Filled 2020-11-05: qty 10, 30d supply, fill #0
  Filled 2021-01-01: qty 10, 30d supply, fill #1
  Filled 2021-04-22 – 2021-04-30 (×2): qty 10, 30d supply, fill #0

## 2020-11-05 NOTE — Telephone Encounter (Signed)
Script for sumatriptan 100mg  sent to 

## 2020-11-06 ENCOUNTER — Other Ambulatory Visit: Payer: Self-pay

## 2020-11-11 ENCOUNTER — Ambulatory Visit: Payer: Medicare Other

## 2020-11-14 ENCOUNTER — Other Ambulatory Visit: Payer: Self-pay

## 2020-11-14 MED FILL — Amlodipine Besylate Tab 2.5 MG (Base Equivalent): ORAL | 90 days supply | Qty: 90 | Fill #1 | Status: AC

## 2020-11-14 MED FILL — Rosuvastatin Calcium Tab 10 MG: ORAL | 90 days supply | Qty: 90 | Fill #1 | Status: AC

## 2020-11-16 ENCOUNTER — Other Ambulatory Visit: Payer: Self-pay

## 2020-11-16 ENCOUNTER — Ambulatory Visit: Payer: Medicare Other

## 2020-11-16 MED ORDER — HYDROXYZINE HCL 25 MG PO TABS
ORAL_TABLET | ORAL | 0 refills | Status: DC
  Filled 2020-11-16: qty 90, 90d supply, fill #0

## 2020-11-20 ENCOUNTER — Ambulatory Visit
Admission: RE | Admit: 2020-11-20 | Discharge: 2020-11-20 | Disposition: A | Discharge status: Home / Self Care | Payer: Medicare Other | Source: Ambulatory Visit | Attending: Registered Nurse | Admitting: Registered Nurse

## 2020-11-20 ENCOUNTER — Other Ambulatory Visit: Payer: Self-pay

## 2020-11-20 DIAGNOSIS — Z1231 Encounter for screening mammogram for malignant neoplasm of breast: Secondary | ICD-10-CM

## 2020-12-02 ENCOUNTER — Other Ambulatory Visit: Payer: Self-pay

## 2020-12-11 ENCOUNTER — Other Ambulatory Visit: Payer: Self-pay

## 2020-12-11 MED ORDER — NORTRIPTYLINE HCL 50 MG PO CAPS
ORAL_CAPSULE | ORAL | 3 refills | Status: DC
  Filled 2020-12-11 – 2021-08-12 (×4): qty 90, 90d supply, fill #0
  Filled 2021-10-29: qty 90, 90d supply, fill #1

## 2020-12-14 ENCOUNTER — Other Ambulatory Visit: Payer: Self-pay

## 2020-12-14 MED ORDER — ROSUVASTATIN CALCIUM 20 MG PO TABS
ORAL_TABLET | ORAL | 3 refills | Status: DC
  Filled 2020-12-14 – 2021-05-17 (×3): qty 90, 90d supply, fill #0
  Filled 2021-05-17 – 2021-08-17 (×2): qty 90, 90d supply, fill #1

## 2020-12-21 ENCOUNTER — Other Ambulatory Visit: Payer: Self-pay

## 2021-01-01 ENCOUNTER — Other Ambulatory Visit: Payer: Self-pay

## 2021-01-01 MED FILL — Escitalopram Oxalate Tab 10 MG (Base Equiv): ORAL | 90 days supply | Qty: 90 | Fill #1 | Status: AC

## 2021-01-01 MED FILL — Pantoprazole Sodium EC Tab 20 MG (Base Equiv): ORAL | 90 days supply | Qty: 90 | Fill #1 | Status: AC

## 2021-01-13 ENCOUNTER — Other Ambulatory Visit: Payer: Self-pay

## 2021-01-13 MED FILL — Gabapentin Cap 300 MG: ORAL | 90 days supply | Qty: 90 | Fill #2 | Status: CN

## 2021-01-18 ENCOUNTER — Other Ambulatory Visit: Payer: Self-pay

## 2021-01-18 MED FILL — Gabapentin Cap 300 MG: ORAL | 90 days supply | Qty: 90 | Fill #2 | Status: AC

## 2021-01-19 ENCOUNTER — Ambulatory Visit: Payer: Medicare Other | Admitting: Neurology

## 2021-01-20 ENCOUNTER — Other Ambulatory Visit: Payer: Self-pay

## 2021-01-21 ENCOUNTER — Other Ambulatory Visit: Payer: Self-pay

## 2021-02-22 ENCOUNTER — Other Ambulatory Visit: Payer: Self-pay

## 2021-02-22 MED FILL — Amlodipine Besylate Tab 2.5 MG (Base Equivalent): ORAL | 90 days supply | Qty: 90 | Fill #2 | Status: AC

## 2021-04-20 NOTE — Progress Notes (Unsigned)
NEUROLOGY FOLLOW UP OFFICE NOTE  Connie Arroyo [id number]  Assessment/Plan:   Chronic migraine without aura, without status migrainosus, not intractable Vertigo - peripheral vs migraine-related   Migraine prevention:  increase nortriptyline to 50mg  at bedtime.  If no improvement in 6 weeks, consider switching to CGRP inhibitor Migraine rescue:  Will have her try samples of Ubrelvy Limit use of pain relievers to no more than 2 days out of week to prevent risk of rebound or medication-overuse headache. Keep headache diary Follow up 6 months.     Subjective:  Connie Arroyo is a 61 year old right-handed female with chronic pain syndrome who follows up for headaches.   UPDATE: In August, nortriptyline was increased to 50mg .  She also tried samples of Ubrelvy, which was effective but not covered by her insurance.  She was prescribed sumatriptan Intensity:  moderate to severe Duration:  several hours Frequency:  almost daily Frequency of abortive medication: rizatriptan 2 to 3 days a week. Current NSAIDS/analgesics:  ASA 81mg  daily Current triptans: sumatriptan 100mg  Current ergotamine:  none Current anti-emetic:  Zofran ODT 4mg  Current muscle relaxants:  none Current Antihypertensive medications:  none Current Antidepressant medications:  nortriptyline 50mg  QHS, Lexapro 10mg  Current Anticonvulsant medications:  gabapentin 300mg  QHS Current anti-CGRP:  none Current Vitamins/Herbal/Supplements:  D Current Antihistamines/Decongestants:  none Other therapy:  none Hormone/birth control:  None Other medications:  Hydroxyzine   Drinks tea, ginger ale.  1 L water daily.  Rarely Pepsi.  No coffee.   Skips breakfast, sometimes skips lunch. Sleep is okay.   HISTORY:  She has had migraines since her 3s.  It is a severe pounding headache either right temple or left occiput.  She has associated vertigo, nausea, photophobia, phonophobia, but no visual disturbance, vomiting, speech  disturbance, numbness and weakness.  They typically last 2 to 7 days.  Headaches are almost daily.  She reports significant stress caring for her teenage granddaughter.     She has had episodes of syncope.  She had an MRI of brain without contrast performed on 01/25/2017 to evaluate syncopal spell associated with migraine, which showed "two punctate signal change foci of the subcortical white matter."   She also has history of vertigo.     Family history of aneurysm.  CTA of head and neck from 05/16/2019 as normal.  MRI of brain on 05/29/2019 personally reviewed and T2 foci in the cerebral white matter but no acute findings.       Past NSAIDS/analgesics:  Ibuprofen, naproxen, Fioricet Past abortive triptans:  Relpax 40mg  (helpful but not covered), rizatriptan 10mg  Past abortive ergotamine:  none Past muscle relaxants:  none Past anti-emetic:  none Past antihypertensive medications:  atenolol Past antidepressant medications:  none Past anticonvulsant medications:  Topiramate, Depakote Past anti-CGRP:  Ubrelvy (effective but not covered by insurance) Past vitamins/Herbal/Supplements:  none Past antihistamines/decongestants:  none Other past therapies:  Namenda, occipital nerve block (increased headache)     Family history:  Sister (migraines, aneurysm ruptured), mother (aneurysm), uncle (aneurysm).  She was checked for aneurysm and was negative.  PAST MEDICAL HISTORY: Past Medical History:  Diagnosis Date   Allergy    Anemia    Anxiety    Arthritis    GERD (gastroesophageal reflux disease)    High cholesterol    History of stomach ulcers    Hypercholesteremia    Hypertension    Migraine headache    PONV (postoperative nausea and vomiting)    Red blood cell abnormality    "  small red blood cells" per pt    MEDICATIONS: Current Outpatient Medications on File Prior to Visit  Medication Sig Dispense Refill   amLODipine (NORVASC) 2.5 MG tablet TAKE 1 TABLET (2.5 MG) BY ORAL ROUTE  ONCE DAILY 90 tablet 3   aspirin 81 MG tablet Take 81 mg by mouth daily.     Cyanocobalamin (VITAMIN B 12 PO) Take 1,000 mcg by mouth daily.     cyclobenzaprine (FLEXERIL) 10 MG tablet TAKE 1 TABLET (10 MG) BY ORAL ROUTE 3 TIMES PER DAY AS NEEDED FOR MUSCLE SPASM 30 tablet 0   escitalopram (LEXAPRO) 10 MG tablet TAKE 1 TABLET (10 MG TOTAL) BY MOUTH DAILY. 90 tablet 1   fexofenadine (ALLEGRA) 180 MG tablet Take 180 mg by mouth daily.     gabapentin (NEURONTIN) 300 MG capsule TAKE 1 CAPSULE (300 MG) BY MOUTH DAILY AT BEDTIME 90 capsule 3   hydrOXYzine (ATARAX/VISTARIL) 25 MG tablet TAKE 1 TABLET (25 MG) BY ORAL ROUTE 3 TIMES PER DAY AS NEEDED 90 tablet 0   hydrOXYzine (ATARAX/VISTARIL) 25 MG tablet take 1 tablet (25 mg) by oral route 3 times per day as needed 90 tablet 0   meclizine (ANTIVERT) 25 MG tablet TAKE 1 TABLET (25 MG) BY ORAL ROUTE 3 TIMES PER DAY AS NEEDED 90 tablet 3   meloxicam (MOBIC) 15 MG tablet Take 15 mg by mouth daily. (Patient not taking: No sig reported)     Multiple Vitamins-Minerals (ONE-A-DAY WOMENS PO) Take 1 tablet by mouth daily.     nortriptyline (PAMELOR) 50 MG capsule Take 1 capsule (50 mg total) by mouth at bedtime. 30 capsule 5   nortriptyline (PAMELOR) 50 MG capsule take 1 capsule (50 mg) by oral route once daily at bedtime 90 capsule 3   pantoprazole (PROTONIX) 20 MG tablet TAKE 1 TABLET (20 MG) BY ORAL ROUTE ONCE DAILY 90 tablet 3   rosuvastatin (CRESTOR) 10 MG tablet TAKE 1 TABLET (10 MG) BY ORAL ROUTE ONCE DAILY AT BEDTIME FOR 90 DAYS 90 tablet 3   rosuvastatin (CRESTOR) 20 MG tablet take 1 tablet (20 mg) by oral route once daily at bedtime for 90 days 90 tablet 3   SUMAtriptan (IMITREX) 100 MG tablet Take 1 tablet at earliest onset of migraine. May repeat in 2 hours if headache persists or recurs.  Maximum 2 tablets in 24 hours. 10 tablet 5   No current facility-administered medications on file prior to visit.    ALLERGIES: Allergies  Allergen Reactions    Benadryl [Diphenhydramine] Other (See Comments)    Agitation and hallucinations   Codeine Nausea And Vomiting    No rash, swelling or other allergic sx   Penicillins Nausea And Vomiting    FAMILY HISTORY: Family History  Problem Relation Age of Onset   Hypertension Mother    Colon polyps Mother    Diabetes Father    Hypertension Father    Stroke Father    Anuerysm Sister    Stroke Sister    Breast cancer Paternal Grandmother    Breast cancer Paternal Aunt    Narcolepsy Paternal Aunt    Colon cancer Neg Hx    Esophageal cancer Neg Hx    Rectal cancer Neg Hx    Stomach cancer Neg Hx       Objective:  *** General: No acute distress.  Patient appears ***-groomed.   Head:  Normocephalic/atraumatic Eyes:  Fundi examined but not visualized Neck: supple, no paraspinal tenderness, full range of motion Heart:  Regular rate and rhythm Lungs:  Clear to auscultation bilaterally Back: No paraspinal tenderness Neurological Exam: alert and oriented to person, place, and time.  Speech fluent and not dysarthric, language intact.  CN II-XII intact. Bulk and tone normal, muscle strength 5/5 throughout.  Sensation to light touch intact.  Deep tendon reflexes 2+ throughout, toes downgoing.  Finger to nose testing intact.  Gait normal, Romberg negative.   Shon Millet, DO  CC: ***

## 2021-04-22 ENCOUNTER — Other Ambulatory Visit: Payer: Self-pay

## 2021-04-22 ENCOUNTER — Ambulatory Visit: Payer: Medicare Other | Admitting: Neurology

## 2021-04-27 ENCOUNTER — Telehealth: Payer: Self-pay | Admitting: Neurology

## 2021-04-27 NOTE — Telephone Encounter (Signed)
Tried calling pt, Pt needs to be evaluated if this is a new problem. Pt schedule to see Dr.Jaffe in March.  LMVOM for pt.

## 2021-04-27 NOTE — Telephone Encounter (Signed)
Pt called in stating she has been having trouble sleeping. She is only able to sleep 2-3 hours a night. She would like to see if Dr. Everlena Cooper can prescribe something.

## 2021-04-28 NOTE — Telephone Encounter (Signed)
LMOVM to give the office a call back.

## 2021-04-29 ENCOUNTER — Other Ambulatory Visit: Payer: Self-pay

## 2021-04-30 ENCOUNTER — Other Ambulatory Visit: Payer: Self-pay

## 2021-04-30 ENCOUNTER — Other Ambulatory Visit: Payer: Self-pay | Admitting: Registered Nurse

## 2021-05-03 ENCOUNTER — Other Ambulatory Visit: Payer: Self-pay

## 2021-05-03 MED ORDER — CYCLOBENZAPRINE HCL 10 MG PO TABS
ORAL_TABLET | ORAL | 0 refills | Status: DC
  Filled 2021-05-03: qty 90, 30d supply, fill #0

## 2021-05-04 ENCOUNTER — Other Ambulatory Visit (HOSPITAL_COMMUNITY): Payer: Self-pay

## 2021-05-04 ENCOUNTER — Other Ambulatory Visit: Payer: Self-pay

## 2021-05-05 ENCOUNTER — Other Ambulatory Visit: Payer: Self-pay

## 2021-05-17 ENCOUNTER — Other Ambulatory Visit: Payer: Self-pay

## 2021-05-17 ENCOUNTER — Other Ambulatory Visit: Payer: Self-pay | Admitting: Registered Nurse

## 2021-05-18 ENCOUNTER — Other Ambulatory Visit: Payer: Self-pay

## 2021-05-20 ENCOUNTER — Other Ambulatory Visit: Payer: Self-pay

## 2021-05-21 ENCOUNTER — Other Ambulatory Visit: Payer: Self-pay

## 2021-05-21 MED ORDER — AMLODIPINE BESYLATE 2.5 MG PO TABS
2.5000 mg | ORAL_TABLET | Freq: Every day | ORAL | 3 refills | Status: DC
  Filled 2021-05-21: qty 90, 90d supply, fill #0
  Filled 2021-08-17: qty 90, 90d supply, fill #1
  Filled 2021-11-17: qty 90, 90d supply, fill #2
  Filled 2022-02-22: qty 90, 90d supply, fill #3

## 2021-05-24 ENCOUNTER — Other Ambulatory Visit: Payer: Self-pay

## 2021-06-14 NOTE — Progress Notes (Signed)
? ?NEUROLOGY FOLLOW UP OFFICE NOTE ? ?[name] ?[id number] ? ?Assessment/Plan:  ? ?Migraine without aura, without status migrainosus, not intractable ? ?  ?Migraine prevention:  She would like to remain on current dose of nortriptyline 50mg  at bedtime for now ?Migraine rescue:  Sumatriptan.  I will resubmit a PA for Ubrelvy again.  She has tried sumatriptan, rizatriptan, eletriptan.  was effective. ?Recommend speaking with a counselor/therapist regarding ongoing anxiety ?Limit use of pain relievers to no more than 2 days out of week to prevent risk of rebound or medication-overuse headache. ?Keep headache diary ?Follow up 6 months. ?  ?  ?Subjective:  ?Michaiah Maiden is a 61 year old right-handed female with chronic pain syndrome who follows up for headache. ?  ?UPDATE: ?Increased nortriptyline to 50mg  in August.  Ubrelvy effective but insurance would not cover.  Started sumatriptan 100mg .  . ?No improvement ?Intensity:  moderate to severe ?Duration:  Takes sumatriptan- makes her sleep for 4 hours and wakes up okay. ?Frequency:  almost daily ?Frequency of abortive medication:  Sumatriptan or Aleve 4-5 days a week ?Current NSAIDS/analgesics:  ASA 81mg  daily, Aleve ?Current triptans:  sumatriptan 100mg  ?Current ergotamine:  none ?Current anti-emetic:  Zofran ODT 4mg  ?Current muscle relaxants:  cyclobenzaprine 10mg  TID PRN ?Current Antihypertensive medications:  none ?Current Antidepressant medications:  nortriptyline 50mg  QHS, Lexapro 10mg  ?Current Anticonvulsant medications: gabapentin 300mg  QHS ?Current anti-CGRP:  none ?Current Vitamins/Herbal/Supplements:  D ?Current Antihistamines/Decongestants:  none ?Other therapy:  none ?Hormone/birth control:  None ?Other medications:  Hydroxyzine ?  ?Drinks tea, ginger ale.  1 L water daily.  Rarely Pepsi.  No coffee.   ?Skips breakfast, sometimes skips lunch. ?Sleep is okay. ?Increased stress.  Her dog passed away unexpectedly and he was a  for her. ?  ?HISTORY:  ?She has had migraines since her 61s.  It is a severe pounding headache either right temple or left occiput.  She has associated vertigo, nausea, photophobia, phonophobia, but no visual disturbance, vomiting, speech disturbance, numbness and weakness.  They typically last 2 to 7 days.  Headaches are almost daily.  She reports significant stress caring for her teenage granddaughter.   ?  ?She has had episodes of syncope.  She had an MRI of brain without contrast performed on 01/25/2017 to evaluate syncopal spell associated with migraine, which showed "two punctate signal change foci of the subcortical white matter." ?  ?She also has history of vertigo.   ?  ?Family history of aneurysm.  CTA of head and neck from 05/16/2019 as normal.  MRI of brain on 05/29/2019 personally reviewed and T2 foci in the cerebral white matter but no acute findings.   ?  ?  ?Past NSAIDS/analgesics:  Ibuprofen, naproxen, Fioricet, meloxicam ?Past abortive triptans:  Relpax 40mg  (helpful but not covered), rizatriptan 10mg  ?Past abortive ergotamine:  none ?Past muscle relaxants:  none ?Past anti-emetic:  none ?Past antihypertensive medications:  atenolol ?Past antidepressant medications:  none ?Past anticonvulsant medications:  Topiramate, Depakote ?Past anti-CGRP:  (effective but not covered by insurance) ?Past vitamins/Herbal/Supplements:  none ?Past antihistamines/decongestants:  hydroxyzine ?Other past therapies:  Namenda, occipital nerve block (increased headache) ?  ?  ?Family history:  Sister (migraines, aneurysm ruptured), mother (aneurysm), uncle (aneurysm).  She was checked for aneurysm and was negative. ? ?PAST MEDICAL HISTORY: ?Past Medical History:  ?Diagnosis Date  ? Allergy   ? Anemia   ? Anxiety   ? Arthritis   ? GERD (gastroesophageal reflux disease)   ?  High cholesterol   ? History of stomach ulcers   ? Hypercholesteremia   ? Hypertension   ? Migraine headache   ? PONV (postoperative nausea and  vomiting)   ? Red blood cell abnormality   ? "small red blood cells" per pt  ? ? ?MEDICATIONS: ?Current Outpatient Medications on File Prior to Visit  ?Medication Sig Dispense Refill  ? amLODipine (NORVASC) 2.5 MG tablet TAKE 1 TABLET (2.5 MG) BY ORAL ROUTE ONCE DAILY 90 tablet 3  ? amLODipine (NORVASC) 2.5 MG tablet Take 1 tablet (2.5 mg total) by mouth daily. 90 tablet 3  ? aspirin 81 MG tablet Take 81 mg by mouth daily.    ? Cyanocobalamin (VITAMIN B 12 PO) Take 1,000 mcg by mouth daily.    ? cyclobenzaprine (FLEXERIL) 10 MG tablet Take 1 tablet by mouth 3 times daily as needed for muscle spasm 90 tablet 0  ? escitalopram (LEXAPRO) 10 MG tablet TAKE 1 TABLET (10 MG TOTAL) BY MOUTH DAILY. 90 tablet 1  ? fexofenadine (ALLEGRA) 180 MG tablet Take 180 mg by mouth daily.    ? gabapentin (NEURONTIN) 300 MG capsule TAKE 1 CAPSULE (300 MG) BY MOUTH DAILY AT BEDTIME 90 capsule 3  ? hydrOXYzine (ATARAX/VISTARIL) 25 MG tablet take 1 tablet (25 mg) by oral route 3 times per day as needed 90 tablet 0  ? meloxicam (MOBIC) 15 MG tablet Take 15 mg by mouth daily. (Patient not taking: No sig reported)    ? Multiple Vitamins-Minerals (ONE-A-DAY WOMENS PO) Take 1 tablet by mouth daily.    ? nortriptyline (PAMELOR) 50 MG capsule Take 1 capsule (50 mg total) by mouth at bedtime. 30 capsule 5  ? nortriptyline (PAMELOR) 50 MG capsule take 1 capsule (50 mg) by oral route once daily at bedtime 90 capsule 3  ? pantoprazole (PROTONIX) 20 MG tablet TAKE 1 TABLET (20 MG) BY ORAL ROUTE ONCE DAILY 90 tablet 3  ? rosuvastatin (CRESTOR) 10 MG tablet TAKE 1 TABLET (10 MG) BY ORAL ROUTE ONCE DAILY AT BEDTIME FOR 90 DAYS 90 tablet 3  ? rosuvastatin (CRESTOR) 20 MG tablet take 1 tablet (20 mg) by oral route once daily at bedtime for 90 days 90 tablet 3  ? SUMAtriptan (IMITREX) 100 MG tablet Take 1 tablet at earliest onset of migraine. May repeat in 2 hours if headache persists or recurs.  Maximum 2 tablets in 24 hours. 10 tablet 5  ? ?No current  facility-administered medications on file prior to visit.  ? ? ?ALLERGIES: ?Allergies  ?Allergen Reactions  ? Benadryl [Diphenhydramine] Other (See Comments)  ?  Agitation and hallucinations  ? Codeine Nausea And Vomiting  ?  No rash, swelling or other allergic sx  ? Penicillins Nausea And Vomiting  ? ? ?FAMILY HISTORY: ?Family History  ?Problem Relation Age of Onset  ? Hypertension Mother   ? Colon polyps Mother   ? Diabetes Father   ? Hypertension Father   ? Stroke Father   ? Anuerysm Sister   ? Stroke Sister   ? Breast cancer Paternal Grandmother   ? Breast cancer Paternal Aunt   ? Narcolepsy Paternal Aunt   ? Colon cancer Neg Hx   ? Esophageal cancer Neg Hx   ? Rectal cancer Neg Hx   ? Stomach cancer Neg Hx   ? ? ?  ?Objective:  ?Blood pressure (!) 145/93, pulse 87, height  (1.626 m), weight 175 lb (79.4 kg), SpO2 98 %. ?General: No acute distress.  Patient  appears well-groomed.   ?Head:  Normocephalic/atraumatic ?Eyes:  Fundi examined but not visualized ?Neck: supple, no paraspinal tenderness, full range of motion ?Heart:  Regular rate and rhythm ?Lungs:  Clear to auscultation bilaterally ?Back: No paraspinal tenderness ?Neurological Exam: alert and oriented to person, place, and time.  Speech fluent and not dysarthric, language intact.  CN II-XII intact. Bulk and tone normal, muscle strength 5/5 throughout.  Sensation to light touch intact.  Deep tendon reflexes 2+ throughout.  Finger to nose testing intact.  Gait normal, Romberg negative. ? ? ?Shon Millet, DO ? ?CC: Loura Back, NP ? ? ? ? ? ? ?

## 2021-06-15 ENCOUNTER — Ambulatory Visit (INDEPENDENT_AMBULATORY_CARE_PROVIDER_SITE_OTHER): Payer: Medicare Other | Admitting: Neurology

## 2021-06-15 ENCOUNTER — Encounter: Payer: Self-pay | Admitting: Neurology

## 2021-06-15 VITALS — BP 145/93 | HR 87 | Ht 64.0 in | Wt 175.0 lb

## 2021-06-15 DIAGNOSIS — G43009 Migraine without aura, not intractable, without status migrainosus: Secondary | ICD-10-CM | POA: Diagnosis not present

## 2021-06-15 NOTE — Patient Instructions (Signed)
No change to management for now ?- nortriptyline 50mg  at bedtime ?- sumatriptan as directed.  Will resubmit for Ubrelvy again ?- Limit use of pain relievers to no more than 2 days out of week to prevent risk of rebound or medication-overuse headache. ?- Keep headache diary ?- Follow up 6 months. ?

## 2021-07-12 ENCOUNTER — Other Ambulatory Visit: Payer: Self-pay

## 2021-07-12 ENCOUNTER — Other Ambulatory Visit: Payer: Self-pay | Admitting: Registered Nurse

## 2021-07-26 ENCOUNTER — Other Ambulatory Visit: Payer: Self-pay

## 2021-07-26 MED ORDER — MECLIZINE HCL 25 MG PO TABS
ORAL_TABLET | ORAL | 1 refills | Status: AC
  Filled 2021-07-26 (×2): qty 90, 30d supply, fill #0

## 2021-07-26 MED ORDER — PANTOPRAZOLE SODIUM 20 MG PO TBEC
DELAYED_RELEASE_TABLET | ORAL | 3 refills | Status: DC
  Filled 2021-07-26: qty 90, 90d supply, fill #0
  Filled 2021-10-29: qty 90, 90d supply, fill #1
  Filled 2022-02-05: qty 90, 90d supply, fill #2
  Filled 2022-05-22 – 2022-05-23 (×2): qty 90, 90d supply, fill #3

## 2021-07-28 ENCOUNTER — Other Ambulatory Visit: Payer: Self-pay

## 2021-08-12 ENCOUNTER — Other Ambulatory Visit: Payer: Self-pay

## 2021-08-13 ENCOUNTER — Other Ambulatory Visit: Payer: Self-pay

## 2021-08-16 ENCOUNTER — Other Ambulatory Visit: Payer: Self-pay

## 2021-08-17 ENCOUNTER — Other Ambulatory Visit: Payer: Self-pay

## 2021-08-18 ENCOUNTER — Other Ambulatory Visit (HOSPITAL_COMMUNITY): Payer: Self-pay

## 2021-08-23 ENCOUNTER — Other Ambulatory Visit: Payer: Self-pay

## 2021-08-23 ENCOUNTER — Other Ambulatory Visit: Payer: Self-pay | Admitting: Nurse Practitioner

## 2021-08-23 DIAGNOSIS — F419 Anxiety disorder, unspecified: Secondary | ICD-10-CM

## 2021-08-24 ENCOUNTER — Other Ambulatory Visit: Payer: Self-pay | Admitting: Registered Nurse

## 2021-08-24 ENCOUNTER — Other Ambulatory Visit: Payer: Self-pay

## 2021-08-24 DIAGNOSIS — F419 Anxiety disorder, unspecified: Secondary | ICD-10-CM

## 2021-08-27 ENCOUNTER — Other Ambulatory Visit: Payer: Self-pay | Admitting: Registered Nurse

## 2021-08-27 ENCOUNTER — Other Ambulatory Visit: Payer: Self-pay

## 2021-08-27 ENCOUNTER — Other Ambulatory Visit (HOSPITAL_COMMUNITY): Payer: Self-pay

## 2021-08-27 DIAGNOSIS — F419 Anxiety disorder, unspecified: Secondary | ICD-10-CM

## 2021-08-27 MED ORDER — ESCITALOPRAM OXALATE 10 MG PO TABS
ORAL_TABLET | ORAL | 3 refills | Status: DC
  Filled 2021-08-27 (×2): qty 90, 90d supply, fill #0

## 2021-08-27 MED ORDER — GABAPENTIN 300 MG PO CAPS
ORAL_CAPSULE | ORAL | 3 refills | Status: DC
  Filled 2021-08-27: qty 90, 90d supply, fill #0
  Filled 2021-11-17: qty 90, 90d supply, fill #1
  Filled 2021-12-17: qty 90, 90d supply, fill #0

## 2021-08-30 ENCOUNTER — Other Ambulatory Visit (HOSPITAL_COMMUNITY): Payer: Self-pay

## 2021-08-30 ENCOUNTER — Other Ambulatory Visit: Payer: Self-pay

## 2021-10-14 ENCOUNTER — Other Ambulatory Visit: Payer: Self-pay | Admitting: Registered Nurse

## 2021-10-14 DIAGNOSIS — Z1231 Encounter for screening mammogram for malignant neoplasm of breast: Secondary | ICD-10-CM

## 2021-10-29 ENCOUNTER — Other Ambulatory Visit: Payer: Self-pay

## 2021-11-01 ENCOUNTER — Other Ambulatory Visit: Payer: Self-pay

## 2021-11-04 ENCOUNTER — Other Ambulatory Visit: Payer: Self-pay

## 2021-11-05 ENCOUNTER — Other Ambulatory Visit: Payer: Self-pay

## 2021-11-05 ENCOUNTER — Other Ambulatory Visit (HOSPITAL_COMMUNITY): Payer: Self-pay

## 2021-11-05 MED ORDER — ROSUVASTATIN CALCIUM 20 MG PO TABS
ORAL_TABLET | ORAL | 3 refills | Status: DC
  Filled 2021-11-05 (×2): qty 90, 90d supply, fill #0
  Filled 2022-02-20: qty 90, 90d supply, fill #1

## 2021-11-06 ENCOUNTER — Other Ambulatory Visit (HOSPITAL_COMMUNITY): Payer: Self-pay

## 2021-11-09 ENCOUNTER — Other Ambulatory Visit (HOSPITAL_COMMUNITY): Payer: Self-pay

## 2021-11-17 ENCOUNTER — Other Ambulatory Visit: Payer: Self-pay

## 2021-11-19 ENCOUNTER — Other Ambulatory Visit: Payer: Self-pay

## 2021-11-22 ENCOUNTER — Ambulatory Visit: Payer: Medicare Other

## 2021-11-22 ENCOUNTER — Other Ambulatory Visit: Payer: Self-pay

## 2021-11-29 ENCOUNTER — Other Ambulatory Visit: Payer: Self-pay

## 2021-12-08 ENCOUNTER — Ambulatory Visit: Payer: Medicare Other

## 2021-12-09 ENCOUNTER — Other Ambulatory Visit: Payer: Self-pay

## 2021-12-13 NOTE — Progress Notes (Unsigned)
NEUROLOGY FOLLOW UP OFFICE NOTE  Connie Arroyo [id number]  Assessment/Plan:   Migraine without aura, without status migrainosus, not intractable    Migraine prevention:  She would like to remain on current dose of nortriptyline 50mg  at bedtime for now Migraine rescue:  Sumatriptan.  I will resubmit a PA for Ubrelvy again.  She has tried sumatriptan, rizatriptan, eletriptan.  Roselyn Meier was effective. Recommend speaking with a counselor/therapist regarding ongoing anxiety Limit use of pain relievers to no more than 2 days out of week to prevent risk of rebound or medication-overuse headache. Keep headache diary Follow up 6 months.     Subjective:  Connie Arroyo is a 61 year old right-handed female with chronic pain syndrome who follows up for headache.   UPDATE: Increased nortriptyline to 50mg  in August.  Ubrelvy effective but insurance would not cover.  Started sumatriptan 100mg .  . No improvement Intensity:  moderate to severe Duration:  Takes sumatriptan- makes her sleep for 4 hours and wakes up okay. Frequency:  almost daily Frequency of abortive medication:  Sumatriptan or Aleve 4-5 days a week Current NSAIDS/analgesics:  ASA 81mg  daily, Aleve Current triptans:  sumatriptan 100mg  Current ergotamine:  none Current anti-emetic:  Zofran ODT 4mg  Current muscle relaxants:  cyclobenzaprine 10mg  TID PRN Current Antihypertensive medications:  none Current Antidepressant medications:  nortriptyline 50mg  QHS, Lexapro 10mg  Current Anticonvulsant medications: gabapentin 300mg  QHS Current anti-CGRP:  none Current Vitamins/Herbal/Supplements:  D Current Antihistamines/Decongestants:  none Other therapy:  none Hormone/birth control:  None Other medications:  Hydroxyzine   Drinks tea, ginger ale.  1 L water daily.  Rarely Pepsi.  No coffee.   Skips breakfast, sometimes skips lunch. Sleep is okay. Increased stress.  Her dog passed away unexpectedly and he was a Scientist, physiological  for her.   HISTORY:  She has had migraines since her 90s.  It is a severe pounding headache either right temple or left occiput.  She has associated vertigo, nausea, photophobia, phonophobia, but no visual disturbance, vomiting, speech disturbance, numbness and weakness.  They typically last 2 to 7 days.  Headaches are almost daily.  She reports significant stress caring for her teenage granddaughter.     She has had episodes of syncope.  She had an MRI of brain without contrast performed on 01/25/2017 to evaluate syncopal spell associated with migraine, which showed "two punctate signal change foci of the subcortical white matter."   She also has history of vertigo.     Family history of aneurysm.  CTA of head and neck from 05/16/2019 as normal.  MRI of brain on 05/29/2019 personally reviewed and T2 foci in the cerebral white matter but no acute findings.       Past NSAIDS/analgesics:  Ibuprofen, naproxen, Fioricet, meloxicam Past abortive triptans:  Relpax 40mg  (helpful but not covered), rizatriptan 10mg  Past abortive ergotamine:  none Past muscle relaxants:  none Past anti-emetic:  none Past antihypertensive medications:  atenolol Past antidepressant medications:  none Past anticonvulsant medications:  Topiramate, Depakote Past anti-CGRP:  Ubrelvy (effective but not covered by insurance) Past vitamins/Herbal/Supplements:  none Past antihistamines/decongestants:  hydroxyzine Other past therapies:  Namenda, occipital nerve block (increased headache)     Family history:  Sister (migraines, aneurysm ruptured), mother (aneurysm), uncle (aneurysm).  She was checked for aneurysm and was negative.  PAST MEDICAL HISTORY: Past Medical History:  Diagnosis Date   Allergy    Anemia    Anxiety    Arthritis    GERD (gastroesophageal reflux disease)  High cholesterol    History of stomach ulcers    Hypercholesteremia    Hypertension    Migraine headache    PONV (postoperative nausea and  vomiting)    Red blood cell abnormality    "small red blood cells" per pt    MEDICATIONS: Current Outpatient Medications on File Prior to Visit  Medication Sig Dispense Refill   amLODipine (NORVASC) 2.5 MG tablet TAKE 1 TABLET (2.5 MG) BY ORAL ROUTE ONCE DAILY 90 tablet 3   amLODipine (NORVASC) 2.5 MG tablet Take 1 tablet (2.5 mg total) by mouth daily. 90 tablet 3   aspirin 81 MG tablet Take 81 mg by mouth daily.     Cyanocobalamin (VITAMIN B 12 PO) Take 1,000 mcg by mouth daily.     cyclobenzaprine (FLEXERIL) 10 MG tablet Take 1 tablet by mouth 3 times daily as needed for muscle spasm 90 tablet 0   escitalopram (LEXAPRO) 10 MG tablet TAKE 1 TABLET (10 MG TOTAL) BY MOUTH DAILY. 90 tablet 1   escitalopram (LEXAPRO) 10 MG tablet take 1 tablet by mouth daily 90 tablet 3   fexofenadine (ALLEGRA) 180 MG tablet Take 180 mg by mouth daily.     gabapentin (NEURONTIN) 300 MG capsule TAKE 1 CAPSULE (300 MG) BY MOUTH DAILY AT BEDTIME 90 capsule 3   gabapentin (NEURONTIN) 300 MG capsule take 1 capsule by mouth daily at bedtime 90 capsule 3   hydrOXYzine (ATARAX/VISTARIL) 25 MG tablet take 1 tablet (25 mg) by oral route 3 times per day as needed 90 tablet 0   meclizine (DRAMAMINE II) 25 MG tablet take 1 tablet (25 mg) by oral route 3 times per day as needed 90 tablet 1   meloxicam (MOBIC) 15 MG tablet Take 15 mg by mouth daily. (Patient not taking: Reported on 06/15/2021)     Multiple Vitamins-Minerals (ONE-A-DAY WOMENS PO) Take 1 tablet by mouth daily. (Patient not taking: Reported on 06/15/2021)     nortriptyline (PAMELOR) 50 MG capsule Take 1 capsule (50 mg total) by mouth at bedtime. 30 capsule 5   nortriptyline (PAMELOR) 50 MG capsule take 1 capsule (50 mg) by oral route once daily at bedtime 90 capsule 3   pantoprazole (PROTONIX) 20 MG tablet TAKE 1 TABLET (20 MG) BY ORAL ROUTE ONCE DAILY 90 tablet 3   pantoprazole (PROTONIX) 20 MG tablet take 1 tablet (20 mg) by mouth once daily 90 tablet 3    rosuvastatin (CRESTOR) 10 MG tablet TAKE 1 TABLET (10 MG) BY ORAL ROUTE ONCE DAILY AT BEDTIME FOR 90 DAYS 90 tablet 3   rosuvastatin (CRESTOR) 20 MG tablet take 1 tablet (20 mg) by oral route once daily at bedtime for 90 days 90 tablet 3   rosuvastatin (CRESTOR) 20 MG tablet Take 1 tablet (20 mg) by mouth once daily at bedtime 90 tablet 3   SUMAtriptan (IMITREX) 100 MG tablet Take 1 tablet at earliest onset of migraine. May repeat in 2 hours if headache persists or recurs.  Maximum 2 tablets in 24 hours. 10 tablet 5   No current facility-administered medications on file prior to visit.    ALLERGIES: Allergies  Allergen Reactions   Benadryl [Diphenhydramine] Other (See Comments)    Agitation and hallucinations   Codeine Nausea And Vomiting    No rash, swelling or other allergic sx   Penicillins Nausea And Vomiting    FAMILY HISTORY: Family History  Problem Relation Age of Onset   Hypertension Mother    Colon polyps Mother  Diabetes Father    Hypertension Father    Stroke Father    Anuerysm Sister    Stroke Sister    Breast cancer Paternal Grandmother    Breast cancer Paternal Aunt    Narcolepsy Paternal Aunt    Colon cancer Neg Hx    Esophageal cancer Neg Hx    Rectal cancer Neg Hx    Stomach cancer Neg Hx       Objective:  *** General: No acute distress.  Patient appears well-groomed.   Head:  Normocephalic/atraumatic Eyes:  Fundi examined but not visualized Neck: supple, no paraspinal tenderness, full range of motion Heart:  Regular rate and rhythm Neurological Exam: ***   Shon Millet, DO  CC: Loura Back, NP

## 2021-12-15 ENCOUNTER — Other Ambulatory Visit: Payer: Self-pay

## 2021-12-15 ENCOUNTER — Ambulatory Visit (INDEPENDENT_AMBULATORY_CARE_PROVIDER_SITE_OTHER): Payer: Medicare Other | Admitting: Neurology

## 2021-12-15 ENCOUNTER — Encounter: Payer: Self-pay | Admitting: Neurology

## 2021-12-15 VITALS — BP 135/89 | HR 88 | Ht 64.0 in | Wt 177.0 lb

## 2021-12-15 DIAGNOSIS — G43009 Migraine without aura, not intractable, without status migrainosus: Secondary | ICD-10-CM | POA: Diagnosis not present

## 2021-12-15 MED ORDER — UBRELVY 100 MG PO TABS
1.0000 | ORAL_TABLET | ORAL | 11 refills | Status: DC | PRN
  Filled 2021-12-15: qty 8, 30d supply, fill #0
  Filled 2021-12-17 (×2): qty 16, 30d supply, fill #0
  Filled 2022-05-22 – 2022-05-23 (×2): qty 16, 30d supply, fill #1

## 2021-12-15 MED ORDER — NORTRIPTYLINE HCL 50 MG PO CAPS
100.0000 mg | ORAL_CAPSULE | Freq: Every day | ORAL | 5 refills | Status: DC
  Filled 2021-12-15 – 2022-01-19 (×2): qty 60, 30d supply, fill #0
  Filled 2022-03-25: qty 60, 30d supply, fill #1
  Filled 2022-05-31: qty 60, 30d supply, fill #2
  Filled 2022-08-22: qty 60, 30d supply, fill #3
  Filled 2022-08-22: qty 60, 30d supply, fill #0
  Filled 2022-11-29: qty 60, 30d supply, fill #1

## 2021-12-15 NOTE — Patient Instructions (Signed)
Increase nortriptyline to 100mg  at bedtime.  If no improvement in 8 weeks, contact me Will try to get Roselyn Meier covered again Limit use of pain relievers to no more than 2 days out of week to prevent risk of rebound or medication-overuse headache. Follow up 6 months.

## 2021-12-16 ENCOUNTER — Telehealth: Payer: Self-pay | Admitting: Anesthesiology

## 2021-12-16 ENCOUNTER — Other Ambulatory Visit: Payer: Self-pay

## 2021-12-16 NOTE — Telephone Encounter (Signed)
Per Patient her insurance will cover Urblevy.  Advised patient script was sent 12/15/21. If she has any problems please call me back in the morning and I will resend it.

## 2021-12-16 NOTE — Telephone Encounter (Signed)
Patient has questions about her new prescription for Ubrelvy.

## 2021-12-17 ENCOUNTER — Telehealth: Payer: Self-pay

## 2021-12-17 ENCOUNTER — Telehealth: Payer: Self-pay | Admitting: Neurology

## 2021-12-17 ENCOUNTER — Other Ambulatory Visit (HOSPITAL_COMMUNITY): Payer: Self-pay

## 2021-12-17 ENCOUNTER — Other Ambulatory Visit: Payer: Self-pay

## 2021-12-17 NOTE — Telephone Encounter (Signed)
Patient advised.

## 2021-12-17 NOTE — Telephone Encounter (Signed)
Pt said her pharmacy sent over a form for her to get Ubrelvy. Tomi Likens needs to fill it out for her

## 2021-12-17 NOTE — Telephone Encounter (Signed)
Per pharmacy PA needed

## 2021-12-17 NOTE — Telephone Encounter (Signed)
Patient Advocate Encounter   Received notification that prior authorization is required for Ubrelvy 100MG  tablets  Submitted: 12-17-2021 Key [id number]  Status is pending

## 2021-12-17 NOTE — Telephone Encounter (Signed)
Patient Advocate Encounter  Prior Authorization for Roselyn Meier 100MG  tablets has been approved.    Effective: 12-17-2021 to 03-07-2023

## 2021-12-17 NOTE — Telephone Encounter (Signed)
PA submitted. Updated will be in other encounter

## 2021-12-20 ENCOUNTER — Other Ambulatory Visit (HOSPITAL_COMMUNITY): Payer: Self-pay

## 2021-12-21 ENCOUNTER — Other Ambulatory Visit (HOSPITAL_COMMUNITY): Payer: Self-pay

## 2022-01-04 ENCOUNTER — Ambulatory Visit: Payer: Medicare Other

## 2022-01-19 ENCOUNTER — Other Ambulatory Visit: Payer: Self-pay

## 2022-02-07 ENCOUNTER — Other Ambulatory Visit: Payer: Self-pay

## 2022-02-16 ENCOUNTER — Encounter: Payer: Self-pay | Admitting: Neurology

## 2022-02-21 ENCOUNTER — Other Ambulatory Visit: Payer: Self-pay

## 2022-02-22 ENCOUNTER — Other Ambulatory Visit: Payer: Self-pay

## 2022-03-02 ENCOUNTER — Ambulatory Visit
Admission: RE | Admit: 2022-03-02 | Discharge: 2022-03-02 | Disposition: A | Discharge status: Home / Self Care | Payer: Medicare Other | Source: Ambulatory Visit | Attending: Registered Nurse | Admitting: Registered Nurse

## 2022-03-02 DIAGNOSIS — Z1231 Encounter for screening mammogram for malignant neoplasm of breast: Secondary | ICD-10-CM

## 2022-03-28 ENCOUNTER — Other Ambulatory Visit: Payer: Self-pay

## 2022-04-22 ENCOUNTER — Other Ambulatory Visit (HOSPITAL_COMMUNITY): Payer: Self-pay

## 2022-04-22 ENCOUNTER — Other Ambulatory Visit: Payer: Self-pay

## 2022-04-22 MED ORDER — MONTELUKAST SODIUM 10 MG PO TABS
10.0000 mg | ORAL_TABLET | Freq: Every day | ORAL | 3 refills | Status: DC
  Filled 2022-04-22: qty 90, 90d supply, fill #0
  Filled 2022-10-28: qty 90, 90d supply, fill #1
  Filled 2023-02-15: qty 90, 90d supply, fill #2

## 2022-04-22 MED ORDER — DICLOFENAC SODIUM 1 % EX GEL
4.0000 g | Freq: Four times a day (QID) | CUTANEOUS | 11 refills | Status: DC | PRN
Start: 2022-04-22 — End: 2023-06-14
  Filled 2022-04-22: qty 200, 13d supply, fill #0
  Filled 2023-02-27 – 2023-03-06 (×2): qty 200, 13d supply, fill #1

## 2022-05-23 ENCOUNTER — Other Ambulatory Visit: Payer: Self-pay

## 2022-06-01 ENCOUNTER — Other Ambulatory Visit: Payer: Self-pay

## 2022-06-13 ENCOUNTER — Other Ambulatory Visit: Payer: Self-pay | Admitting: Registered Nurse

## 2022-06-13 ENCOUNTER — Other Ambulatory Visit: Payer: Self-pay

## 2022-06-13 ENCOUNTER — Ambulatory Visit
Admission: RE | Admit: 2022-06-13 | Discharge: 2022-06-13 | Disposition: A | Discharge status: Home / Self Care | Payer: Medicaid Other | Source: Ambulatory Visit | Attending: Registered Nurse | Admitting: Registered Nurse

## 2022-06-13 DIAGNOSIS — M5410 Radiculopathy, site unspecified: Secondary | ICD-10-CM

## 2022-06-13 DIAGNOSIS — M545 Low back pain, unspecified: Secondary | ICD-10-CM

## 2022-06-17 ENCOUNTER — Other Ambulatory Visit: Payer: Self-pay | Admitting: Registered Nurse

## 2022-06-17 ENCOUNTER — Other Ambulatory Visit: Payer: Self-pay

## 2022-06-17 ENCOUNTER — Other Ambulatory Visit (HOSPITAL_COMMUNITY): Payer: Self-pay

## 2022-06-17 MED ORDER — AMLODIPINE BESYLATE 2.5 MG PO TABS
2.5000 mg | ORAL_TABLET | Freq: Every day | ORAL | 3 refills | Status: DC
  Filled 2022-06-17 (×2): qty 90, 90d supply, fill #0
  Filled 2022-10-09: qty 90, 90d supply, fill #1
  Filled 2022-10-10: qty 90, 90d supply, fill #0
  Filled 2023-01-26: qty 90, 90d supply, fill #1
  Filled 2023-05-16: qty 90, 90d supply, fill #2

## 2022-06-20 ENCOUNTER — Ambulatory Visit: Payer: Medicare Other | Admitting: Neurology

## 2022-07-11 NOTE — Progress Notes (Unsigned)
Virtual Visit via Video Note  Consent was obtained for video visit:  Yes.   Answered questions that patient had about telehealth interaction:  Yes.   I discussed the limitations, risks, security and privacy concerns of performing an evaluation and management service by telemedicine. I also discussed with the patient that there may be a patient responsible charge related to this service. The patient expressed understanding and agreed to proceed.  Pt location: Home Physician Location: office Name of referring provider:  Loura Back, NP I connected with [name] at patients initiation/request on 07/12/2022 at  2:30 PM EDT by video enabled telemedicine application and verified that I am speaking with the correct person using two identifiers. Pt MRN:  [id number] Pt DOB:  04-05-1960 Video Participants:  [name];    Assessment/Plan:   Migraine without aura, without status migrainosus, not intractable    Migraine prevention:  Decrease nortriptyline back to 50mg  at bedtime.  Plan to start Qulipta 60mg  daily Migraine rescue:  Ubrelvy 100mg .  As second line, will have her try samples of Reyvow 100mg  (knows not to drive for 8 hours after use) Limit use of pain relievers to no more than 2 days out of week to prevent risk of rebound or medication-overuse headache. Keep headache diary Follow up 6 months.     Subjective:  Tiaria Garske is a 62 year old right-handed female with chronic pain syndrome who follows up for headache.   UPDATE: Increased nortriptyline last visit.  No change.  Intensity:  moderate to severe Duration:  30 minutes with Ubrelvy, however ineffective 50% of time.   Frequency:  2-3 a week Frequency of abortive medication:  Sumatriptan or Tylenol 4 days a week Current NSAIDS/analgesics:  ASA 81mg  daily, Tylenol Current triptans:  none Current ergotamine:  none Current anti-emetic:  Zofran ODT 4mg  Current muscle relaxants:  cyclobenzaprine 10mg  TID  PRN Current Antihypertensive medications:  none Current Antidepressant medications:  nortriptyline 100mg  QHS, Lexapro 10mg  Current Anticonvulsant medications: gabapentin 300mg  QHS Current anti-CGRP:  Ubrelvy 100mg  Current Vitamins/Herbal/Supplements:  D Current Antihistamines/Decongestants:  none Other therapy:  none Hormone/birth control:  None Other medications:  Hydroxyzine   Drinks tea, ginger ale.  1 L water daily.  Rarely Pepsi.  No coffee.   Skips breakfast, sometimes skips lunch. Sleep is okay. Increased stress.  Her dog passed away unexpectedly and he was a Financial planner for her.   HISTORY:  She has had migraines since her 85s.  It is a severe pounding headache either right temple or left occiput.  She has associated vertigo, nausea, photophobia, phonophobia, but no visual disturbance, vomiting, speech disturbance, numbness and weakness.  They typically last 2 to 7 days.  Headaches are almost daily.  She reports significant stress caring for her teenage granddaughter.     She has had episodes of syncope.  She had an MRI of brain without contrast performed on 01/25/2017 to evaluate syncopal spell associated with migraine, which showed "two punctate signal change foci of the subcortical white matter."   She also has history of vertigo.     Family history of aneurysm.  CTA of head and neck from 05/16/2019 as normal.  MRI of brain on 05/29/2019 personally reviewed and T2 foci in the cerebral white matter but no acute findings.       Past NSAIDS/analgesics:  Ibuprofen, naproxen, Fioricet, meloxicam Past abortive triptans:  Relpax 40mg  (helpful but not covered), rizatriptan 10mg , sumatriptan (excess drowsniess) Past abortive ergotamine:  none Past muscle  relaxants:  none Past anti-emetic:  none Past antihypertensive medications:  atenolol Past antidepressant medications:  none Past anticonvulsant medications:  Topiramate, Depakote Past anti-CGRP:  none Past  vitamins/Herbal/Supplements:  none Past antihistamines/decongestants:  hydroxyzine Other past therapies:  Namenda, occipital nerve block (increased headache)     Family history:  Sister (migraines, aneurysm ruptured), mother (aneurysm), uncle (aneurysm).  She was checked for aneurysm and was negative.   Past Medical History: Past Medical History:  Diagnosis Date   Allergy    Anemia    Anxiety    Arthritis    GERD (gastroesophageal reflux disease)    High cholesterol    History of stomach ulcers    Hypercholesteremia    Hypertension    Migraine headache    PONV (postoperative nausea and vomiting)    Red blood cell abnormality    "small red blood cells" per pt    Medications: Outpatient Encounter Medications as of 07/12/2022  Medication Sig   amLODipine (NORVASC) 2.5 MG tablet TAKE 1 TABLET (2.5 MG) BY ORAL ROUTE ONCE DAILY   amLODipine (NORVASC) 2.5 MG tablet Take 1 tablet (2.5 mg total) by mouth daily.   amLODipine (NORVASC) 2.5 MG tablet Take 1 tablet (2.5 mg total) by mouth daily.   aspirin 81 MG tablet Take 81 mg by mouth daily.   cyclobenzaprine (FLEXERIL) 10 MG tablet Take 1 tablet by mouth 3 times daily as needed for muscle spasm   diclofenac Sodium (GOODSENSE ARTHRITIS PAIN) 1 % GEL Apply 4 g to each affected area up to 4 times daily as needed for pain.   escitalopram (LEXAPRO) 10 MG tablet TAKE 1 TABLET (10 MG TOTAL) BY MOUTH DAILY.   escitalopram (LEXAPRO) 10 MG tablet take 1 tablet by mouth daily   gabapentin (NEURONTIN) 300 MG capsule TAKE 1 CAPSULE (300 MG) BY MOUTH DAILY AT BEDTIME   gabapentin (NEURONTIN) 300 MG capsule take 1 capsule by mouth daily at bedtime   meclizine (DRAMAMINE II) 25 MG tablet take 1 tablet (25 mg) by oral route 3 times per day as needed   montelukast (SINGULAIR) 10 MG tablet Take 1 tablet (10 mg total) by mouth daily in the afternoon.   nortriptyline (PAMELOR) 50 MG capsule Take 2 capsules (100 mg total) by mouth at bedtime.   pantoprazole  (PROTONIX) 20 MG tablet TAKE 1 TABLET (20 MG) BY ORAL ROUTE ONCE DAILY   pantoprazole (PROTONIX) 20 MG tablet take 1 tablet (20 mg) by mouth once daily   rosuvastatin (CRESTOR) 10 MG tablet TAKE 1 TABLET (10 MG) BY ORAL ROUTE ONCE DAILY AT BEDTIME FOR 90 DAYS   rosuvastatin (CRESTOR) 20 MG tablet take 1 tablet (20 mg) by oral route once daily at bedtime for 90 days   rosuvastatin (CRESTOR) 20 MG tablet Take 1 tablet (20 mg) by mouth once daily at bedtime   SUMAtriptan (IMITREX) 100 MG tablet Take 1 tablet at earliest onset of migraine. May repeat in 2 hours if headache persists or recurs.  Maximum 2 tablets in 24 hours.   Ubrogepant (UBRELVY) 100 MG TABS Take 1 tablet by mouth as needed. May repeat after 2 hours.  Maximum 2 tablets in 24 hours.   No facility-administered encounter medications on file as of 07/12/2022.    Allergies: Allergies  Allergen Reactions   Benadryl [Diphenhydramine] Other (See Comments)    Agitation and hallucinations   Codeine Nausea And Vomiting    No rash, swelling or other allergic sx   Penicillins Nausea And Vomiting  Family History: Family History  Problem Relation Age of Onset   Hypertension Mother    Colon polyps Mother    Diabetes Father    Hypertension Father    Stroke Father    Anuerysm Sister    Stroke Sister    Breast cancer Paternal Grandmother    Breast cancer Paternal Aunt    Narcolepsy Paternal Aunt    Colon cancer Neg Hx    Esophageal cancer Neg Hx    Rectal cancer Neg Hx    Stomach cancer Neg Hx     Observations/Objective:   No acute distress.  Alert and oriented.  Speech fluent and not dysarthric.  Language intact.    Follow Up Instructions:    -I discussed the assessment and treatment plan with the patient. The patient was provided an opportunity to ask questions and all were answered. The patient agreed with the plan and demonstrated an understanding of the instructions.   The patient was advised to call back or seek an  in-person evaluation if the symptoms worsen or if the condition fails to improve as anticipated.  Shon Millet, DO  CC: Loura Back, NP

## 2022-07-12 ENCOUNTER — Telehealth (INDEPENDENT_AMBULATORY_CARE_PROVIDER_SITE_OTHER): Payer: 59 | Admitting: Neurology

## 2022-07-12 ENCOUNTER — Other Ambulatory Visit: Payer: Self-pay

## 2022-07-12 ENCOUNTER — Encounter: Payer: Self-pay | Admitting: Neurology

## 2022-07-12 ENCOUNTER — Other Ambulatory Visit (HOSPITAL_COMMUNITY): Payer: Self-pay

## 2022-07-12 DIAGNOSIS — G43009 Migraine without aura, not intractable, without status migrainosus: Secondary | ICD-10-CM | POA: Diagnosis not present

## 2022-07-12 MED ORDER — QULIPTA 60 MG PO TABS
60.0000 mg | ORAL_TABLET | Freq: Every day | ORAL | 11 refills | Status: DC
  Filled 2022-07-12 (×2): qty 30, 30d supply, fill #0

## 2022-07-12 NOTE — Patient Instructions (Signed)
Decrease nortriptyline back to 50mg  at bedtime.  Plan to start Qulipta 60mg  daily Use Ubrelvy as needed.  If ineffective, try Reyvow as second line (1 tablet in 24 hours)  - advised not to drive for 8 hours after use. Limit use of pain relievers to no more than 2 days out of week to prevent risk of rebound or medication-overuse headache. Keep headache diary Follow up 6 months.

## 2022-07-15 ENCOUNTER — Other Ambulatory Visit: Payer: Self-pay

## 2022-07-18 ENCOUNTER — Other Ambulatory Visit: Payer: Self-pay

## 2022-07-18 ENCOUNTER — Other Ambulatory Visit (HOSPITAL_COMMUNITY): Payer: Self-pay

## 2022-07-18 MED ORDER — ROSUVASTATIN CALCIUM 20 MG PO TABS
20.0000 mg | ORAL_TABLET | Freq: Every evening | ORAL | 3 refills | Status: DC
  Filled 2022-07-18 (×2): qty 90, 90d supply, fill #0

## 2022-07-19 ENCOUNTER — Other Ambulatory Visit (HOSPITAL_COMMUNITY): Payer: Self-pay

## 2022-07-19 MED ORDER — ROSUVASTATIN CALCIUM 40 MG PO TABS
40.0000 mg | ORAL_TABLET | Freq: Every day | ORAL | 3 refills | Status: AC
  Filled 2022-07-19: qty 90, 90d supply, fill #0
  Filled 2022-12-07 – 2022-12-15 (×2): qty 90, 90d supply, fill #1
  Filled 2023-02-27 – 2023-03-06 (×2): qty 90, 90d supply, fill #2

## 2022-08-05 ENCOUNTER — Telehealth: Payer: Self-pay | Admitting: Pharmacy Technician

## 2022-08-05 ENCOUNTER — Other Ambulatory Visit (HOSPITAL_COMMUNITY): Payer: Self-pay

## 2022-08-05 NOTE — Telephone Encounter (Signed)
Patient Advocate Encounter  Received notification from [employer] that prior authorization for QULIPTA 60MG  is required.   PA submitted on 5.31.24 Key [id number] Status is pending

## 2022-08-08 ENCOUNTER — Other Ambulatory Visit (HOSPITAL_COMMUNITY): Payer: Self-pay

## 2022-08-22 ENCOUNTER — Other Ambulatory Visit: Payer: Self-pay

## 2022-08-22 ENCOUNTER — Other Ambulatory Visit (HOSPITAL_COMMUNITY): Payer: Self-pay

## 2022-08-26 ENCOUNTER — Ambulatory Visit: Payer: Self-pay | Admitting: Surgery

## 2022-08-26 NOTE — H&P (Signed)
Subjective    Chief Complaint: Epidermal Cyst       History of Present Illness: Connie Arroyo is a 62 y.o. female who is seen today as an office consultation at the request of Dr. Leonie Man for evaluation of Epidermal Cyst .     This is a 62 year old female who presents with several years of a slowly enlarging mass in the center of her chest just below her breast.  This has become larger and more uncomfortable.  When she wears a bra, it is irritated.  It has never become infected.  No drainage noted.  She is evaluated by dermatology who felt that this was a sebaceous cyst.  The patient has a significant phobia of needles so there has been no attempt to excise this under local anesthetic.     Review of Systems: A complete review of systems was obtained from the patient.  I have reviewed this information and discussed as appropriate with the patient.  See HPI as well for other ROS.   Review of Systems  Constitutional: Negative.   HENT: Negative.    Eyes: Negative.   Respiratory: Negative.    Cardiovascular: Negative.   Gastrointestinal: Negative.   Genitourinary: Negative.   Musculoskeletal: Negative.   Skin: Negative.   Neurological:  Positive for headaches.  Endo/Heme/Allergies: Negative.   Psychiatric/Behavioral: Negative.          Medical History: Past Medical History      Past Medical History:  Diagnosis Date   Anxiety     Arthritis     GERD (gastroesophageal reflux disease)     H/O shoulder surgery 2009   Hyperlipidemia          Problem List     Patient Active Problem List  Diagnosis   Cyst of skin   High cholesterol   New persistent daily headache        Past Surgical History       Past Surgical History:  Procedure Laterality Date   ESSURE TUBAL LIGATION   1986        Allergies       Allergies  Allergen Reactions   Codeine Vomiting and Nausea And Vomiting      No rash, swelling or other allergic sx        Medications Ordered Prior to  Encounter        Current Outpatient Medications on File Prior to Visit  Medication Sig Dispense Refill   amLODIPine (NORVASC) 2.5 MG tablet         escitalopram oxalate (LEXAPRO) 10 MG tablet         gabapentin (NEURONTIN) 300 MG capsule take 1 capsule by mouth daily at bedtime       nortriptyline (PAMELOR) 50 MG capsule Take by mouth       rosuvastatin (CRESTOR) 20 MG tablet take 1 tablet (20 mg) by oral route once daily at bedtime for 90 days       UBRELVY 100 mg Tab Take by mouth       fexofenadine (ALLEGRA) 180 MG tablet Take 180 mg by mouth once daily        No current facility-administered medications on file prior to visit.        Family History       Family History  Problem Relation Age of Onset   High blood pressure (Hypertension) Mother     Hyperlipidemia (Elevated cholesterol) Mother     Skin cancer Father     Obesity Father  High blood pressure (Hypertension) Father     Hyperlipidemia (Elevated cholesterol) Father     Diabetes Father     Skin cancer Sister          Tobacco Use History  Social History       Tobacco Use  Smoking Status Never  Smokeless Tobacco Never        Social History  Social History        Socioeconomic History   Marital status: Single  Tobacco Use   Smoking status: Never   Smokeless tobacco: Never  Vaping Use   Vaping status: Never Used  Substance and Sexual Activity   Alcohol use: Not Currently   Drug use: Not Currently        Objective:          Vitals:    08/26/22 0911 08/26/22 0912  BP: 122/78    Pulse: (!) 111    Temp: 36.7 C (98 F)    SpO2: 98%    Weight: 78.6 kg (173 lb 3.2 oz)    Height: 162.6 cm (5\' 4" )    PainSc:   0-No pain    Body mass index is 29.73 kg/m.   Physical Exam    Constitutional:  WDWN in NAD, conversant, no obvious deformities; lying in bed comfortably Eyes:  Pupils equal, round; sclera anicteric; moist conjunctiva; no lid lag HENT:  Oral mucosa moist; good dentition  Neck:  No  masses palpated, trachea midline; no thyromegaly Lungs:  CTA bilaterally; normal respiratory effort Chest - in the midline between the lower edge of the breasts, there is a visible palpable 3 cm subcutaneous mass with a central punctate opening.  No sign of infection.  No drainage. CV:  Regular rate and rhythm; no murmurs; extremities well-perfused with no edema Abd:  +bowel sounds, soft, non-tender, no palpable organomegaly; no palpable hernias Musc:  Unable to assess gait; no apparent clubbing or cyanosis in extremities Lymphatic:  No palpable cervical or axillary lymphadenopathy Skin:  Warm, dry; no sign of jaundice Psychiatric - alert and oriented x 4; calm mood and affect       Assessment and Plan:  Diagnoses and all orders for this visit:   Sebaceous cyst Comments: 3 cm - over xiphoid       Recommend excision of the sebaceous cyst of the chest under anesthesia and outpatient surgery.   The surgical procedure has been discussed with the patient.  Potential risks, benefits, alternative treatments, and expected outcomes have been explained.  All of the patient's questions at this time have been answered.  The likelihood of reaching the patient's treatment goal is good.  The patient understand the proposed surgical procedure and wishes to proceed.       Lissa Morales, MD  08/26/2022 9:33 AM

## 2022-09-15 NOTE — Telephone Encounter (Signed)
Pharmacy Patient Advocate Encounter  Received notification from [facility] that Prior Authorization for Qulipta 60MG  tablets has been APPROVED from 08/05/2022 to 03/07/2023.Marland Kitchen  PA #/Case ID/Reference #: [id number]

## 2022-09-19 ENCOUNTER — Other Ambulatory Visit: Payer: Self-pay

## 2022-09-19 ENCOUNTER — Other Ambulatory Visit (HOSPITAL_COMMUNITY): Payer: Self-pay

## 2022-09-19 MED ORDER — PANTOPRAZOLE SODIUM 20 MG PO TBEC
DELAYED_RELEASE_TABLET | ORAL | 3 refills | Status: DC
  Filled 2022-09-19: qty 90, 90d supply, fill #0

## 2022-09-19 MED ORDER — PANTOPRAZOLE SODIUM 20 MG PO TBEC
20.0000 mg | DELAYED_RELEASE_TABLET | Freq: Every day | ORAL | 3 refills | Status: DC
  Filled 2022-09-19 – 2022-09-23 (×2): qty 90, 90d supply, fill #0
  Filled 2023-01-11: qty 90, 90d supply, fill #1
  Filled 2023-04-27 – 2023-05-01 (×2): qty 90, 90d supply, fill #2
  Filled 2023-07-28: qty 90, 90d supply, fill #3

## 2022-09-23 ENCOUNTER — Other Ambulatory Visit (HOSPITAL_COMMUNITY): Payer: Self-pay

## 2022-09-23 ENCOUNTER — Other Ambulatory Visit: Payer: Self-pay

## 2022-09-26 ENCOUNTER — Other Ambulatory Visit: Payer: Self-pay

## 2022-09-26 ENCOUNTER — Other Ambulatory Visit (HOSPITAL_COMMUNITY): Payer: Self-pay

## 2022-09-26 MED ORDER — ESCITALOPRAM OXALATE 10 MG PO TABS
10.0000 mg | ORAL_TABLET | Freq: Every day | ORAL | 3 refills | Status: DC
  Filled 2022-09-26: qty 90, 90d supply, fill #0
  Filled 2023-02-27 – 2023-03-06 (×2): qty 90, 90d supply, fill #1

## 2022-09-27 ENCOUNTER — Other Ambulatory Visit (HOSPITAL_COMMUNITY): Payer: Self-pay

## 2022-10-04 ENCOUNTER — Other Ambulatory Visit: Payer: Self-pay

## 2022-10-04 ENCOUNTER — Other Ambulatory Visit (HOSPITAL_COMMUNITY): Payer: Self-pay

## 2022-10-04 MED ORDER — TRAMADOL HCL 50 MG PO TABS
50.0000 mg | ORAL_TABLET | Freq: Four times a day (QID) | ORAL | 0 refills | Status: DC | PRN
  Filled 2022-10-04: qty 20, 5d supply, fill #0

## 2022-10-10 ENCOUNTER — Other Ambulatory Visit: Payer: Self-pay

## 2022-10-10 ENCOUNTER — Other Ambulatory Visit (HOSPITAL_COMMUNITY): Payer: Self-pay

## 2022-10-28 ENCOUNTER — Other Ambulatory Visit (HOSPITAL_COMMUNITY): Payer: Self-pay

## 2022-10-28 ENCOUNTER — Other Ambulatory Visit: Payer: Self-pay | Admitting: Registered Nurse

## 2022-10-28 ENCOUNTER — Other Ambulatory Visit: Payer: Self-pay

## 2022-11-04 ENCOUNTER — Other Ambulatory Visit (HOSPITAL_COMMUNITY): Payer: Self-pay

## 2022-11-04 MED ORDER — CYCLOBENZAPRINE HCL 10 MG PO TABS
10.0000 mg | ORAL_TABLET | Freq: Three times a day (TID) | ORAL | 0 refills | Status: AC | PRN
  Filled 2022-11-04: qty 90, 30d supply, fill #0

## 2022-11-05 ENCOUNTER — Other Ambulatory Visit (HOSPITAL_BASED_OUTPATIENT_CLINIC_OR_DEPARTMENT_OTHER): Payer: Self-pay

## 2022-11-22 LAB — LAB REPORT - SCANNED
EGFR: 69
HM Hepatitis Screen: NEGATIVE

## 2022-11-30 ENCOUNTER — Other Ambulatory Visit (HOSPITAL_COMMUNITY): Payer: Self-pay

## 2022-12-07 ENCOUNTER — Other Ambulatory Visit (HOSPITAL_COMMUNITY): Payer: Self-pay

## 2022-12-15 ENCOUNTER — Other Ambulatory Visit (HOSPITAL_COMMUNITY): Payer: Self-pay

## 2022-12-27 ENCOUNTER — Other Ambulatory Visit: Payer: Self-pay | Admitting: Registered Nurse

## 2022-12-27 DIAGNOSIS — Z1231 Encounter for screening mammogram for malignant neoplasm of breast: Secondary | ICD-10-CM

## 2023-01-11 ENCOUNTER — Other Ambulatory Visit: Payer: Self-pay | Admitting: Neurology

## 2023-01-12 ENCOUNTER — Other Ambulatory Visit (HOSPITAL_COMMUNITY): Payer: Self-pay

## 2023-01-13 NOTE — Telephone Encounter (Signed)
Called both numbers and neither number worked. She doesn't have mychart

## 2023-01-18 ENCOUNTER — Other Ambulatory Visit: Payer: Self-pay | Admitting: Registered Nurse

## 2023-01-18 ENCOUNTER — Other Ambulatory Visit (HOSPITAL_COMMUNITY): Payer: Self-pay

## 2023-01-24 ENCOUNTER — Other Ambulatory Visit: Payer: Self-pay

## 2023-01-24 ENCOUNTER — Other Ambulatory Visit: Payer: Self-pay | Admitting: Registered Nurse

## 2023-01-24 ENCOUNTER — Other Ambulatory Visit (HOSPITAL_COMMUNITY): Payer: Self-pay

## 2023-01-24 MED ORDER — NORTRIPTYLINE HCL 50 MG PO CAPS
50.0000 mg | ORAL_CAPSULE | Freq: Every day | ORAL | 3 refills | Status: DC
  Filled 2023-01-24: qty 90, 90d supply, fill #0
  Filled 2023-06-13: qty 90, 90d supply, fill #1

## 2023-01-27 ENCOUNTER — Other Ambulatory Visit (HOSPITAL_COMMUNITY): Payer: Self-pay

## 2023-02-16 ENCOUNTER — Other Ambulatory Visit (HOSPITAL_COMMUNITY): Payer: Self-pay

## 2023-02-17 ENCOUNTER — Other Ambulatory Visit: Payer: Self-pay

## 2023-02-17 ENCOUNTER — Other Ambulatory Visit (HOSPITAL_COMMUNITY): Payer: Self-pay

## 2023-02-17 MED ORDER — MONTELUKAST SODIUM 10 MG PO TABS
10.0000 mg | ORAL_TABLET | Freq: Every evening | ORAL | 3 refills | Status: DC
  Filled 2023-02-17 (×2): qty 90, 90d supply, fill #0
  Filled 2023-06-13: qty 90, 90d supply, fill #1

## 2023-02-27 ENCOUNTER — Other Ambulatory Visit (HOSPITAL_COMMUNITY): Payer: Self-pay

## 2023-02-28 ENCOUNTER — Other Ambulatory Visit: Payer: Self-pay

## 2023-03-03 ENCOUNTER — Other Ambulatory Visit: Payer: Self-pay

## 2023-03-06 ENCOUNTER — Other Ambulatory Visit (HOSPITAL_COMMUNITY): Payer: Self-pay

## 2023-03-06 ENCOUNTER — Other Ambulatory Visit: Payer: Self-pay

## 2023-03-06 ENCOUNTER — Ambulatory Visit: Payer: 59

## 2023-03-15 ENCOUNTER — Other Ambulatory Visit (HOSPITAL_COMMUNITY): Payer: Self-pay

## 2023-03-15 ENCOUNTER — Other Ambulatory Visit: Payer: Self-pay | Admitting: Neurology

## 2023-03-15 ENCOUNTER — Other Ambulatory Visit: Payer: Self-pay

## 2023-03-15 ENCOUNTER — Telehealth: Payer: Self-pay | Admitting: Neurology

## 2023-03-15 MED ORDER — GABAPENTIN 300 MG PO CAPS
300.0000 mg | ORAL_CAPSULE | Freq: Every day | ORAL | 3 refills | Status: DC
  Filled 2023-03-15: qty 90, 90d supply, fill #0

## 2023-03-15 MED ORDER — UBRELVY 100 MG PO TABS
1.0000 | ORAL_TABLET | ORAL | 11 refills | Status: AC | PRN
  Filled 2023-03-15: qty 16, 30d supply, fill #0
  Filled 2023-08-23: qty 16, 30d supply, fill #1

## 2023-03-15 NOTE — Telephone Encounter (Signed)
 Pt needs refill on Bernita Raisin she uses the [facility]

## 2023-03-15 NOTE — Telephone Encounter (Signed)
 The patient was called and RX sent to [facility]

## 2023-03-16 ENCOUNTER — Other Ambulatory Visit (HOSPITAL_COMMUNITY): Payer: Self-pay

## 2023-03-16 ENCOUNTER — Other Ambulatory Visit: Payer: Self-pay

## 2023-04-27 ENCOUNTER — Other Ambulatory Visit (HOSPITAL_COMMUNITY): Payer: Self-pay

## 2023-05-01 ENCOUNTER — Other Ambulatory Visit (HOSPITAL_COMMUNITY): Payer: Self-pay

## 2023-05-05 ENCOUNTER — Ambulatory Visit (INDEPENDENT_AMBULATORY_CARE_PROVIDER_SITE_OTHER): Payer: Medicaid Other | Admitting: Family Medicine

## 2023-05-05 ENCOUNTER — Other Ambulatory Visit: Payer: Self-pay

## 2023-05-05 ENCOUNTER — Encounter: Payer: Self-pay | Admitting: Family Medicine

## 2023-05-05 VITALS — BP 112/81 | HR 80 | Ht 64.0 in | Wt 170.6 lb

## 2023-05-05 DIAGNOSIS — Z23 Encounter for immunization: Secondary | ICD-10-CM

## 2023-05-05 DIAGNOSIS — D649 Anemia, unspecified: Secondary | ICD-10-CM | POA: Diagnosis not present

## 2023-05-05 DIAGNOSIS — K219 Gastro-esophageal reflux disease without esophagitis: Secondary | ICD-10-CM | POA: Insufficient documentation

## 2023-05-05 DIAGNOSIS — F419 Anxiety disorder, unspecified: Secondary | ICD-10-CM

## 2023-05-05 DIAGNOSIS — I159 Secondary hypertension, unspecified: Secondary | ICD-10-CM

## 2023-05-05 DIAGNOSIS — Z1231 Encounter for screening mammogram for malignant neoplasm of breast: Secondary | ICD-10-CM | POA: Diagnosis not present

## 2023-05-05 DIAGNOSIS — Z0000 Encounter for general adult medical examination without abnormal findings: Secondary | ICD-10-CM

## 2023-05-05 DIAGNOSIS — E7800 Pure hypercholesterolemia, unspecified: Secondary | ICD-10-CM | POA: Insufficient documentation

## 2023-05-05 DIAGNOSIS — R7303 Prediabetes: Secondary | ICD-10-CM | POA: Diagnosis not present

## 2023-05-05 DIAGNOSIS — T7840XA Allergy, unspecified, initial encounter: Secondary | ICD-10-CM | POA: Insufficient documentation

## 2023-05-05 DIAGNOSIS — I10 Essential (primary) hypertension: Secondary | ICD-10-CM | POA: Insufficient documentation

## 2023-05-05 DIAGNOSIS — T7840XS Allergy, unspecified, sequela: Secondary | ICD-10-CM

## 2023-05-05 DIAGNOSIS — M1990 Unspecified osteoarthritis, unspecified site: Secondary | ICD-10-CM

## 2023-05-05 DIAGNOSIS — G43909 Migraine, unspecified, not intractable, without status migrainosus: Secondary | ICD-10-CM | POA: Insufficient documentation

## 2023-05-05 DIAGNOSIS — G43819 Other migraine, intractable, without status migrainosus: Secondary | ICD-10-CM

## 2023-05-05 LAB — POCT GLYCOSYLATED HEMOGLOBIN (HGB A1C): HbA1c, POC (prediabetic range): 5.9 % (ref 5.7–6.4)

## 2023-05-05 MED ORDER — ESCITALOPRAM OXALATE 20 MG PO TABS
20.0000 mg | ORAL_TABLET | Freq: Every day | ORAL | 3 refills | Status: DC
  Filled 2023-05-05: qty 30, 30d supply, fill #0
  Filled 2023-07-28: qty 30, 30d supply, fill #1
  Filled 2023-08-23: qty 30, 30d supply, fill #2
  Filled 2023-11-01: qty 30, 30d supply, fill #3

## 2023-05-05 NOTE — Patient Instructions (Addendum)
 It was great to see you today! Thank you for choosing [facility] for your primary care. Connie Arroyo was seen for establishing new patient.  Today we addressed: We are checking your A1C. I will let you know if it is abnormal. We are requesting the rest of your records from [facility]. I have ordered your mammogram and provided the number to you to schedule. You can return the healthcare POA paperwork to our office at your convenience.   If you haven't already, sign up for My Chart to have easy access to your labs results, and communication with your primary care physician.  Call the clinic at [phone] if your symptoms worsen or you have any concerns.  You should return to our clinic Return in about 6 months (around 11/02/2023). Please arrive 15 minutes before your appointment to ensure smooth check in process.  We appreciate your efforts in making this happen.  Thank you for allowing me to participate in your care, Para March, DO 05/05/2023, 2:59 PM PGY-1, [facility]

## 2023-05-05 NOTE — Assessment & Plan Note (Signed)
-   Due for mammogram, ordered today and pt provided with number to call to get this scheduled - HCPOA paperwork provided - Release of records document signed by pt for [facility], will obtain other labs as needed - Vaccinations reviewed and updated

## 2023-05-05 NOTE — Progress Notes (Signed)
 HgbA1C 5.9, stable from previous. Lifestyle modifications and recheck in 1 year.

## 2023-05-05 NOTE — Progress Notes (Cosign Needed Addendum)
 SUBJECTIVE:   CHIEF COMPLAINT / HPI:   Here to establish care Reports anxiety has worsened lately, related to her dog stressing her out and being bored at home, would like to increase her anxiety med No other concerns today  PERTINENT  PMH / PSH: see below  PMHx: Past Medical History:  Diagnosis Date   Allergy    seasonal   Anemia    Anxiety    Arthritis    BPPV (benign paroxysmal positional vertigo)    GERD (gastroesophageal reflux disease)    High cholesterol    History of elbow surgery 08/17/2016   History of stomach ulcers    History of wrist fracture 08/17/2016   Hypercholesteremia    Hypertension    Migraine headache    Red blood cell abnormality    "small red blood cells" per pt   Transient alteration of awareness 07/09/2018   Anemia - pt reports "small red blood cells", 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 Everlena Cooper for migraines, stable BPPV - stable  Surgical Hx: Past Surgical History:  Procedure Laterality Date   ABDOMINAL HYSTERECTOMY  2002   COLONOSCOPY  05/06/2013   [facility] = prep fair   left shoulder surgery  2009   TUBAL LIGATION  1986  L shoulder surgery - bursa repair 2009 Sebaceous cyst removed 12/2022  Family Hx: Family History  Problem Relation Age of Onset   Hypertension Mother    Colon polyps Mother    Hyperlipidemia Mother    Cancer Mother        unknown   Diabetes Father    Hypertension Father    Stroke Father        x2   Anuerysm Sister 18   Stroke Sister 63   Breast cancer Paternal Grandmother    Breast cancer Paternal Aunt    Narcolepsy Paternal Aunt    Bone cancer Other        cousin and aunt   Colon cancer Neg Hx    Esophageal cancer Neg Hx    Rectal cancer Neg Hx    Stomach cancer Neg Hx    Social Hx: Current Social History  Never smoker No alcohol No drugs  Who lives at home: lives alone 05/05/2023  Who would speak for you about health care matters:  05/05/2023 HCPOA  paperwork provided Transportation: no car, working on getting one, aunt drives her 05/05/2023 Important Relationships & Pets: dog Ginger (german shepard) about to give her away 05/05/2023  Current Stressors: neighbor getting on her nerves 05/05/2023 Work / Education:  disabled  05/05/2023   Medications: Med list reviewed and reconciled  Preventative Screening Colonoscopy: year 2022 results repeat in 2027 Mammogram: year 2023 results normal, due Pap test: Hysterectomy  OBJECTIVE:   BP 112/81   Pulse 80   Ht 5\' 4"  (1.626 m)   Wt 170 lb 9.6 oz (77.4 kg)   SpO2 100%   BMI 29.28 kg/m    General: NAD, pleasant, able to participate in exam, ambulates independently  HEENT: normocephalic, TM's visualized bilaterally, no scleral icterus or conjunctival pallor, no nasal discharge, moist mucous membranes Neck: supple, non-tender, without lymphadenopathy. No supraclavicular lymphadneopathy Cardiac: RRR, S1 S2 present. normal heart sounds, no murmurs. 2+ radial pulses bilaterally.  Respiratory: CTAB, normal effort, No wheezes, rales or rhonchi Extremities: no edema. 5/5 strength BUE and BLE. Symmetric sensation BUE and BLE.  Skin: warm and dry Neuro: alert, oriented. PERRLA. EOMI. Facial expressions symmetric Psych: Normal  affect and mood  ASSESSMENT/PLAN:   Prediabetes Pt reports hx prediabetes, requests A1C today. Last A1C in our system 5.9 A1C today 5.9 - Will continue to monitor  Anxiety Worsened, pt requests increase of her anxiety medication. Qtc on last EKG in 2021 normal. Currently taking lexapro 10mg .  - Will increase to Lexapro 20mg  daily - F/u in 1 month  Healthcare maintenance - Due for mammogram, ordered today and pt provided with number to call to get this scheduled - HCPOA paperwork provided - Release of records document signed by pt for [facility], will obtain other labs as needed - Vaccinations reviewed and updated   Para March, DO [facility]

## 2023-05-05 NOTE — Assessment & Plan Note (Signed)
 Pt reports hx prediabetes, requests A1C today. Last A1C in our system 5.9 A1C today 5.9 - Will continue to monitor

## 2023-05-05 NOTE — Assessment & Plan Note (Signed)
 Worsened, pt requests increase of her anxiety medication. Qtc on last EKG in 2021 normal. Currently taking lexapro 10mg .  - Will increase to Lexapro 20mg  daily - F/u in 1 month

## 2023-05-08 ENCOUNTER — Telehealth: Payer: Self-pay

## 2023-05-08 NOTE — Telephone Encounter (Signed)
 Patient is scheduled for 04/3 @145  with Dr.Everhart

## 2023-05-08 NOTE — Telephone Encounter (Signed)
-----   Message from [facility] sent at 05/05/2023  5:46 PM EST ----- Regarding: F/u appt Hey, can we get a f/u appt scheduled for 1 month to discuss lexapro increase? I think she forgot to schedule otw out  Thank you Kirstie

## 2023-05-11 ENCOUNTER — Other Ambulatory Visit (HOSPITAL_COMMUNITY): Payer: Self-pay

## 2023-05-16 ENCOUNTER — Other Ambulatory Visit (HOSPITAL_COMMUNITY): Payer: Self-pay

## 2023-05-16 ENCOUNTER — Ambulatory Visit

## 2023-05-25 ENCOUNTER — Other Ambulatory Visit (HOSPITAL_COMMUNITY): Payer: Self-pay

## 2023-05-26 ENCOUNTER — Ambulatory Visit
Admission: RE | Admit: 2023-05-26 | Discharge: 2023-05-26 | Disposition: A | Discharge status: Home / Self Care | Source: Ambulatory Visit | Attending: Family Medicine | Admitting: Family Medicine

## 2023-05-26 DIAGNOSIS — Z1231 Encounter for screening mammogram for malignant neoplasm of breast: Secondary | ICD-10-CM

## 2023-06-02 ENCOUNTER — Other Ambulatory Visit (HOSPITAL_COMMUNITY): Payer: Self-pay

## 2023-06-08 ENCOUNTER — Ambulatory Visit: Admitting: Family Medicine

## 2023-06-09 ENCOUNTER — Ambulatory Visit: Admitting: Family Medicine

## 2023-06-09 NOTE — Progress Notes (Deleted)
    SUBJECTIVE:   CHIEF COMPLAINT / HPI:   F/u on Lexapro increase - went from 10mg  to 20mg  05/05/23  PERTINENT  PMH / PSH: HTN, migraine, GERD, anemia, anxiety, HLD, prediabetes  OBJECTIVE:   There were no vitals taken for this visit.  ***  ASSESSMENT/PLAN:   Assessment & Plan      Para March, DO [facility]

## 2023-06-13 ENCOUNTER — Other Ambulatory Visit (HOSPITAL_COMMUNITY): Payer: Self-pay

## 2023-06-13 NOTE — Progress Notes (Unsigned)
    SUBJECTIVE:   CHIEF COMPLAINT / HPI:   F/u on Lexapro increase - went from 10mg  to 20mg  05/05/23  PERTINENT  PMH / PSH: HTN, migraine, GERD, anemia, anxiety, HLD, prediabetes  OBJECTIVE:   There were no vitals taken for this visit.  ***  ASSESSMENT/PLAN:   Assessment & Plan      Para March, DO [facility]

## 2023-06-14 ENCOUNTER — Other Ambulatory Visit (HOSPITAL_COMMUNITY): Payer: Self-pay

## 2023-06-14 ENCOUNTER — Ambulatory Visit: Admitting: Family Medicine

## 2023-06-14 ENCOUNTER — Encounter: Payer: Self-pay | Admitting: Family Medicine

## 2023-06-14 ENCOUNTER — Other Ambulatory Visit: Payer: Self-pay

## 2023-06-14 VITALS — BP 104/78 | HR 95 | Ht 64.0 in | Wt 170.6 lb

## 2023-06-14 DIAGNOSIS — F419 Anxiety disorder, unspecified: Secondary | ICD-10-CM

## 2023-06-14 DIAGNOSIS — R42 Dizziness and giddiness: Secondary | ICD-10-CM | POA: Diagnosis not present

## 2023-06-14 DIAGNOSIS — L731 Pseudofolliculitis barbae: Secondary | ICD-10-CM | POA: Diagnosis not present

## 2023-06-14 MED ORDER — MONTELUKAST SODIUM 10 MG PO TABS
10.0000 mg | ORAL_TABLET | Freq: Every evening | ORAL | 3 refills | Status: AC
  Filled 2023-06-14: qty 90, 90d supply, fill #0
  Filled 2023-09-11: qty 90, 90d supply, fill #1
  Filled 2023-12-14 – 2023-12-21 (×2): qty 30, 30d supply, fill #2
  Filled 2024-01-01: qty 1, 1d supply, fill #2
  Filled 2024-01-09: qty 90, 90d supply, fill #2
  Filled 2024-04-04 – 2024-04-12 (×2): qty 90, 90d supply, fill #3

## 2023-06-14 MED ORDER — NORTRIPTYLINE HCL 50 MG PO CAPS
50.0000 mg | ORAL_CAPSULE | Freq: Every day | ORAL | 3 refills | Status: AC
  Filled 2023-06-14: qty 90, 90d supply, fill #0
  Filled 2023-09-11: qty 90, 90d supply, fill #1
  Filled 2023-12-14 – 2024-01-01 (×3): qty 30, 30d supply, fill #2
  Filled 2024-01-09: qty 90, 90d supply, fill #2
  Filled 2024-04-04 – 2024-04-12 (×2): qty 30, 30d supply, fill #3

## 2023-06-14 NOTE — Patient Instructions (Signed)
 Good to see you today - Thank you for coming in  Things we discussed today:  I am glad the Lexapro increase is working well for you. Consider vestibular rehab for your vertigo I do not think we need to increase your blood pressure medicine at this time because your blood pressure is in normal range. Use exfoliate over your ingrown hair bump and can try a pimple patch to help draw out the hair   Come back to see me in 6 months

## 2023-06-14 NOTE — Assessment & Plan Note (Addendum)
 Stable, continue Lexapro 20mg  daily.

## 2023-06-19 ENCOUNTER — Telehealth: Payer: Self-pay

## 2023-06-19 NOTE — Telephone Encounter (Signed)
 Patient calls nurse line regarding concerns with intermittent episodes of uncontrollable shaking. She reports that at times she will have shaking in her left hand and "jumping" in her leg and knee.   She states that this has been going on for the last few months.   Denies facial drooping, slurred speech, arm weakness, visual changes or new balance issues. She does have a history of vertigo.   Scheduled with Dr. Becki Bouton on 07/03/23. ED/return precautions discussed.   Elsie Halo, RN

## 2023-07-03 ENCOUNTER — Ambulatory Visit: Admitting: Family Medicine

## 2023-07-26 NOTE — Progress Notes (Deleted)
    SUBJECTIVE:   CHIEF COMPLAINT / HPI:   Feet peeling  PERTINENT  PMH / PSH: HTN, migraine, GERD, tendonitis, arthritis, headache, prediabetes, allergies, anemia  OBJECTIVE:   There were no vitals taken for this visit.  ***  ASSESSMENT/PLAN:   Assessment & Plan      Sarahann Cumins, DO [facility]

## 2023-07-27 ENCOUNTER — Ambulatory Visit: Admitting: Family Medicine

## 2023-07-28 ENCOUNTER — Other Ambulatory Visit (HOSPITAL_COMMUNITY): Payer: Self-pay

## 2023-08-02 NOTE — Progress Notes (Deleted)
    SUBJECTIVE:   CHIEF COMPLAINT / HPI:   ***  PERTINENT  PMH / PSH: HTN, migraine, GERD, anemia, anxiety  OBJECTIVE:   There were no vitals taken for this visit.  ***  ASSESSMENT/PLAN:   Assessment & Plan      Sarahann Cumins, DO [facility]

## 2023-08-03 ENCOUNTER — Ambulatory Visit: Admitting: Family Medicine

## 2023-08-07 ENCOUNTER — Other Ambulatory Visit (HOSPITAL_COMMUNITY): Payer: Self-pay

## 2023-08-23 ENCOUNTER — Other Ambulatory Visit (HOSPITAL_COMMUNITY): Payer: Self-pay

## 2023-09-11 ENCOUNTER — Other Ambulatory Visit (HOSPITAL_COMMUNITY): Payer: Self-pay

## 2023-09-19 ENCOUNTER — Other Ambulatory Visit (HOSPITAL_COMMUNITY): Payer: Self-pay

## 2023-09-19 ENCOUNTER — Other Ambulatory Visit: Payer: Self-pay | Admitting: Family Medicine

## 2023-09-19 ENCOUNTER — Other Ambulatory Visit: Payer: Self-pay

## 2023-09-19 MED ORDER — AMLODIPINE BESYLATE 2.5 MG PO TABS
2.5000 mg | ORAL_TABLET | Freq: Every day | ORAL | 3 refills | Status: AC
  Filled 2023-09-19: qty 90, 90d supply, fill #0
  Filled 2023-12-14 – 2024-01-01 (×3): qty 30, 30d supply, fill #1
  Filled 2024-01-09: qty 90, 90d supply, fill #1
  Filled 2024-04-04 – 2024-04-12 (×2): qty 90, 90d supply, fill #2

## 2023-11-01 ENCOUNTER — Other Ambulatory Visit (HOSPITAL_COMMUNITY): Payer: Self-pay

## 2023-11-02 ENCOUNTER — Other Ambulatory Visit (HOSPITAL_COMMUNITY): Payer: Self-pay

## 2023-11-02 ENCOUNTER — Other Ambulatory Visit: Payer: Self-pay | Admitting: Family Medicine

## 2023-11-03 ENCOUNTER — Other Ambulatory Visit: Payer: Self-pay

## 2023-11-03 ENCOUNTER — Other Ambulatory Visit (HOSPITAL_COMMUNITY): Payer: Self-pay

## 2023-11-03 MED ORDER — PANTOPRAZOLE SODIUM 20 MG PO TBEC
20.0000 mg | DELAYED_RELEASE_TABLET | Freq: Every day | ORAL | 3 refills | Status: AC
  Filled 2023-11-03: qty 90, 90d supply, fill #0
  Filled 2024-01-29: qty 90, 90d supply, fill #1

## 2023-11-16 NOTE — Progress Notes (Deleted)
    SUBJECTIVE:   CHIEF COMPLAINT / HPI:   Lumps all over body  PERTINENT  PMH / PSH: wrist tendonitis, prediabetes, anxiety, anemia, GERD, HTN  OBJECTIVE:   There were no vitals taken for this visit.  ***  ASSESSMENT/PLAN:   Assessment & Plan      Elyce Prescott, DO [facility]

## 2023-11-17 ENCOUNTER — Ambulatory Visit: Admitting: Family Medicine

## 2023-11-30 ENCOUNTER — Other Ambulatory Visit: Payer: Self-pay | Admitting: Family Medicine

## 2023-11-30 ENCOUNTER — Other Ambulatory Visit (HOSPITAL_COMMUNITY): Payer: Self-pay

## 2023-11-30 MED ORDER — ESCITALOPRAM OXALATE 20 MG PO TABS
20.0000 mg | ORAL_TABLET | Freq: Every day | ORAL | 3 refills | Status: AC
  Filled 2023-11-30: qty 30, 30d supply, fill #0
  Filled 2024-01-29: qty 30, 30d supply, fill #1
  Filled 2024-03-12: qty 30, 30d supply, fill #2
  Filled 2024-04-12: qty 30, 30d supply, fill #3

## 2023-12-01 ENCOUNTER — Other Ambulatory Visit (HOSPITAL_COMMUNITY): Payer: Self-pay

## 2023-12-01 ENCOUNTER — Other Ambulatory Visit: Payer: Self-pay

## 2023-12-06 ENCOUNTER — Other Ambulatory Visit (HOSPITAL_COMMUNITY): Payer: Self-pay

## 2023-12-06 ENCOUNTER — Other Ambulatory Visit: Payer: Self-pay

## 2023-12-06 MED ORDER — MECLIZINE HCL 25 MG PO TABS
25.0000 mg | ORAL_TABLET | Freq: Three times a day (TID) | ORAL | 0 refills | Status: AC | PRN
  Filled 2023-12-06: qty 90, 30d supply, fill #0

## 2023-12-06 MED ORDER — CYCLOBENZAPRINE HCL 10 MG PO TABS
10.0000 mg | ORAL_TABLET | Freq: Three times a day (TID) | ORAL | 0 refills | Status: AC | PRN
  Filled 2023-12-06: qty 90, 30d supply, fill #0

## 2023-12-14 ENCOUNTER — Other Ambulatory Visit: Payer: Self-pay

## 2023-12-14 ENCOUNTER — Other Ambulatory Visit (HOSPITAL_COMMUNITY): Payer: Self-pay

## 2023-12-15 ENCOUNTER — Other Ambulatory Visit: Payer: Self-pay

## 2023-12-15 ENCOUNTER — Encounter: Payer: Self-pay | Admitting: Pharmacist

## 2023-12-20 ENCOUNTER — Other Ambulatory Visit: Payer: Self-pay

## 2023-12-21 ENCOUNTER — Other Ambulatory Visit: Payer: Self-pay

## 2023-12-21 ENCOUNTER — Other Ambulatory Visit (HOSPITAL_COMMUNITY): Payer: Self-pay

## 2023-12-22 ENCOUNTER — Other Ambulatory Visit: Payer: Self-pay

## 2023-12-22 ENCOUNTER — Encounter: Payer: Self-pay | Admitting: Pharmacist

## 2023-12-26 ENCOUNTER — Other Ambulatory Visit (HOSPITAL_COMMUNITY): Payer: Self-pay

## 2023-12-27 ENCOUNTER — Other Ambulatory Visit: Payer: Self-pay

## 2024-01-01 ENCOUNTER — Other Ambulatory Visit (HOSPITAL_COMMUNITY): Payer: Self-pay

## 2024-01-09 ENCOUNTER — Other Ambulatory Visit (HOSPITAL_COMMUNITY): Payer: Self-pay

## 2024-01-29 ENCOUNTER — Other Ambulatory Visit (HOSPITAL_COMMUNITY): Payer: Self-pay

## 2024-02-22 ENCOUNTER — Other Ambulatory Visit (HOSPITAL_COMMUNITY): Payer: Self-pay

## 2024-03-05 ENCOUNTER — Ambulatory Visit: Admitting: Physician Assistant

## 2024-03-12 ENCOUNTER — Other Ambulatory Visit (HOSPITAL_COMMUNITY): Payer: Self-pay

## 2024-03-13 ENCOUNTER — Ambulatory Visit: Admitting: Physician Assistant

## 2024-03-21 ENCOUNTER — Ambulatory Visit: Admitting: Physician Assistant

## 2024-04-03 ENCOUNTER — Encounter: Admitting: Physician Assistant

## 2024-04-04 ENCOUNTER — Encounter: Payer: Self-pay | Admitting: Pharmacist

## 2024-04-04 ENCOUNTER — Other Ambulatory Visit: Payer: Self-pay

## 2024-04-04 ENCOUNTER — Other Ambulatory Visit (HOSPITAL_COMMUNITY): Payer: Self-pay

## 2024-04-09 ENCOUNTER — Other Ambulatory Visit: Payer: Self-pay

## 2024-04-10 ENCOUNTER — Encounter: Admitting: Physician Assistant

## 2024-04-11 ENCOUNTER — Other Ambulatory Visit (HOSPITAL_COMMUNITY): Payer: Self-pay

## 2024-04-12 ENCOUNTER — Other Ambulatory Visit (HOSPITAL_COMMUNITY): Payer: Self-pay

## 2024-04-24 ENCOUNTER — Encounter: Admitting: Physician Assistant
